# Patient Record
Sex: Female | Born: 1955 | Race: White | Hispanic: No | Marital: Single | Smoking: Former smoker
Health system: Southern US, Community
[De-identification: ages and names within clinical notes are randomized; demographics above are authoritative.]

## PROBLEM LIST (undated history)

## (undated) DIAGNOSIS — C4491 Basal cell carcinoma of skin, unspecified: Secondary | ICD-10-CM

## (undated) DIAGNOSIS — Q631 Lobulated, fused and horseshoe kidney: Secondary | ICD-10-CM

## (undated) DIAGNOSIS — N200 Calculus of kidney: Secondary | ICD-10-CM

## (undated) HISTORY — PX: LIPOMA EXCISION: SHX5283

## (undated) HISTORY — DX: Calculus of kidney: N20.0

## (undated) HISTORY — PX: PARTIAL HYSTERECTOMY: SHX80

## (undated) HISTORY — DX: Lobulated, fused and horseshoe kidney: Q63.1

---

## 1998-04-06 ENCOUNTER — Emergency Department (HOSPITAL_COMMUNITY): Admission: EM | Admit: 1998-04-06 | Discharge: 1998-04-06 | Payer: Self-pay | Admitting: Emergency Medicine

## 1998-11-26 ENCOUNTER — Other Ambulatory Visit: Admission: RE | Admit: 1998-11-26 | Discharge: 1998-11-26 | Payer: Self-pay | Admitting: *Deleted

## 1999-05-27 ENCOUNTER — Emergency Department (HOSPITAL_COMMUNITY): Admission: EM | Admit: 1999-05-27 | Discharge: 1999-05-27 | Payer: Self-pay | Admitting: Emergency Medicine

## 1999-11-01 ENCOUNTER — Other Ambulatory Visit: Admission: RE | Admit: 1999-11-01 | Discharge: 1999-11-01 | Payer: Self-pay | Admitting: *Deleted

## 2000-10-27 ENCOUNTER — Other Ambulatory Visit: Admission: RE | Admit: 2000-10-27 | Discharge: 2000-10-27 | Payer: Self-pay | Admitting: *Deleted

## 2001-11-15 ENCOUNTER — Other Ambulatory Visit: Admission: RE | Admit: 2001-11-15 | Discharge: 2001-11-15 | Payer: Self-pay | Admitting: *Deleted

## 2002-04-27 ENCOUNTER — Emergency Department (HOSPITAL_COMMUNITY): Admission: EM | Admit: 2002-04-27 | Discharge: 2002-04-27 | Payer: Self-pay | Admitting: Emergency Medicine

## 2002-07-19 ENCOUNTER — Encounter: Payer: Self-pay | Admitting: *Deleted

## 2002-07-19 ENCOUNTER — Ambulatory Visit (HOSPITAL_COMMUNITY): Admission: RE | Admit: 2002-07-19 | Discharge: 2002-07-19 | Payer: Self-pay | Admitting: *Deleted

## 2002-07-25 ENCOUNTER — Encounter: Payer: Self-pay | Admitting: Family Medicine

## 2002-07-25 ENCOUNTER — Ambulatory Visit (HOSPITAL_COMMUNITY): Admission: RE | Admit: 2002-07-25 | Discharge: 2002-07-25 | Payer: Self-pay | Admitting: Family Medicine

## 2002-12-22 ENCOUNTER — Other Ambulatory Visit: Admission: RE | Admit: 2002-12-22 | Discharge: 2002-12-22 | Payer: Self-pay | Admitting: *Deleted

## 2002-12-26 ENCOUNTER — Encounter: Admission: RE | Admit: 2002-12-26 | Discharge: 2002-12-26 | Payer: Self-pay | Admitting: *Deleted

## 2002-12-26 ENCOUNTER — Encounter: Payer: Self-pay | Admitting: *Deleted

## 2003-06-16 ENCOUNTER — Encounter: Admission: RE | Admit: 2003-06-16 | Discharge: 2003-06-16 | Payer: Self-pay | Admitting: *Deleted

## 2003-06-16 ENCOUNTER — Encounter: Payer: Self-pay | Admitting: *Deleted

## 2003-12-11 ENCOUNTER — Encounter: Admission: RE | Admit: 2003-12-11 | Discharge: 2003-12-11 | Payer: Self-pay | Admitting: Family Medicine

## 2003-12-19 ENCOUNTER — Other Ambulatory Visit: Admission: RE | Admit: 2003-12-19 | Discharge: 2003-12-19 | Payer: Self-pay | Admitting: Family Medicine

## 2004-12-23 ENCOUNTER — Encounter: Admission: RE | Admit: 2004-12-23 | Discharge: 2004-12-23 | Payer: Self-pay | Admitting: Family Medicine

## 2005-01-10 ENCOUNTER — Other Ambulatory Visit: Admission: RE | Admit: 2005-01-10 | Discharge: 2005-01-10 | Payer: Self-pay | Admitting: Family Medicine

## 2005-12-31 ENCOUNTER — Encounter: Admission: RE | Admit: 2005-12-31 | Discharge: 2005-12-31 | Payer: Self-pay | Admitting: Family Medicine

## 2006-04-22 ENCOUNTER — Other Ambulatory Visit: Admission: RE | Admit: 2006-04-22 | Discharge: 2006-04-22 | Payer: Self-pay | Admitting: Family Medicine

## 2007-01-20 ENCOUNTER — Encounter: Admission: RE | Admit: 2007-01-20 | Discharge: 2007-01-20 | Payer: Self-pay | Admitting: Family Medicine

## 2007-06-22 ENCOUNTER — Other Ambulatory Visit: Admission: RE | Admit: 2007-06-22 | Discharge: 2007-06-22 | Payer: Self-pay | Admitting: Family Medicine

## 2008-01-27 ENCOUNTER — Encounter: Admission: RE | Admit: 2008-01-27 | Discharge: 2008-01-27 | Payer: Self-pay | Admitting: Family Medicine

## 2008-06-15 ENCOUNTER — Other Ambulatory Visit: Admission: RE | Admit: 2008-06-15 | Discharge: 2008-06-15 | Payer: Self-pay | Admitting: Family Medicine

## 2009-01-16 ENCOUNTER — Encounter: Admission: RE | Admit: 2009-01-16 | Discharge: 2009-01-16 | Payer: Self-pay | Admitting: Family Medicine

## 2009-06-20 ENCOUNTER — Other Ambulatory Visit: Admission: RE | Admit: 2009-06-20 | Discharge: 2009-06-20 | Payer: Self-pay | Admitting: Family Medicine

## 2010-04-19 ENCOUNTER — Ambulatory Visit (HOSPITAL_COMMUNITY): Admission: RE | Admit: 2010-04-19 | Discharge: 2010-04-20 | Payer: Self-pay | Admitting: Obstetrics and Gynecology

## 2010-04-19 ENCOUNTER — Encounter (INDEPENDENT_AMBULATORY_CARE_PROVIDER_SITE_OTHER): Payer: Self-pay | Admitting: Obstetrics and Gynecology

## 2010-07-03 ENCOUNTER — Encounter: Admission: RE | Admit: 2010-07-03 | Discharge: 2010-07-03 | Payer: Self-pay | Admitting: Obstetrics and Gynecology

## 2010-09-21 ENCOUNTER — Encounter: Payer: Self-pay | Admitting: Family Medicine

## 2010-11-15 LAB — CBC
Hemoglobin: 11.1 g/dL — ABNORMAL LOW (ref 12.0–15.0)
MCH: 28.7 pg (ref 26.0–34.0)
Platelets: 201 10*3/uL (ref 150–400)
RBC: 3.1 MIL/uL — ABNORMAL LOW (ref 3.87–5.11)
RBC: 3.84 MIL/uL — ABNORMAL LOW (ref 3.87–5.11)
RDW: 14.2 % (ref 11.5–15.5)
WBC: 6.4 10*3/uL (ref 4.0–10.5)
WBC: 7.9 10*3/uL (ref 4.0–10.5)

## 2010-11-15 LAB — TYPE AND SCREEN: ABO/RH(D): O POS

## 2010-11-15 LAB — SURGICAL PCR SCREEN
MRSA, PCR: NEGATIVE
Staphylococcus aureus: NEGATIVE

## 2010-11-15 LAB — BASIC METABOLIC PANEL
BUN: 9 mg/dL (ref 6–23)
Chloride: 104 mEq/L (ref 96–112)
Glucose, Bld: 97 mg/dL (ref 70–99)
Potassium: 3.9 mEq/L (ref 3.5–5.1)

## 2010-12-20 ENCOUNTER — Other Ambulatory Visit: Payer: Self-pay | Admitting: Family Medicine

## 2010-12-20 DIAGNOSIS — I6529 Occlusion and stenosis of unspecified carotid artery: Secondary | ICD-10-CM

## 2010-12-25 ENCOUNTER — Other Ambulatory Visit: Payer: Self-pay

## 2010-12-30 ENCOUNTER — Other Ambulatory Visit: Payer: Self-pay | Admitting: Family Medicine

## 2010-12-30 DIAGNOSIS — E041 Nontoxic single thyroid nodule: Secondary | ICD-10-CM

## 2011-01-01 ENCOUNTER — Other Ambulatory Visit: Payer: Self-pay | Admitting: Diagnostic Radiology

## 2011-01-01 ENCOUNTER — Ambulatory Visit
Admission: RE | Admit: 2011-01-01 | Discharge: 2011-01-01 | Disposition: A | Discharge status: Home / Self Care | Payer: No Typology Code available for payment source | Source: Ambulatory Visit | Attending: Family Medicine | Admitting: Family Medicine

## 2011-01-01 ENCOUNTER — Other Ambulatory Visit (HOSPITAL_COMMUNITY)
Admission: RE | Admit: 2011-01-01 | Discharge: 2011-01-01 | Disposition: A | Discharge status: Home / Self Care | Payer: Self-pay | Source: Ambulatory Visit | Attending: Diagnostic Radiology | Admitting: Diagnostic Radiology

## 2011-01-01 DIAGNOSIS — E041 Nontoxic single thyroid nodule: Secondary | ICD-10-CM

## 2011-01-01 DIAGNOSIS — E049 Nontoxic goiter, unspecified: Secondary | ICD-10-CM | POA: Insufficient documentation

## 2011-05-28 ENCOUNTER — Other Ambulatory Visit: Payer: Self-pay | Admitting: Obstetrics and Gynecology

## 2011-05-28 DIAGNOSIS — Z1231 Encounter for screening mammogram for malignant neoplasm of breast: Secondary | ICD-10-CM

## 2011-07-01 ENCOUNTER — Ambulatory Visit
Admission: RE | Admit: 2011-07-01 | Discharge: 2011-07-01 | Disposition: A | Discharge status: Home / Self Care | Payer: Self-pay | Source: Ambulatory Visit | Attending: Obstetrics and Gynecology | Admitting: Obstetrics and Gynecology

## 2011-07-01 DIAGNOSIS — Z1231 Encounter for screening mammogram for malignant neoplasm of breast: Secondary | ICD-10-CM

## 2012-06-02 ENCOUNTER — Other Ambulatory Visit: Payer: Self-pay | Admitting: Obstetrics and Gynecology

## 2012-06-02 DIAGNOSIS — Z1231 Encounter for screening mammogram for malignant neoplasm of breast: Secondary | ICD-10-CM

## 2012-06-22 ENCOUNTER — Ambulatory Visit: Payer: Self-pay

## 2012-06-30 ENCOUNTER — Ambulatory Visit: Payer: Self-pay

## 2014-02-22 ENCOUNTER — Encounter: Payer: Self-pay | Admitting: Cardiology

## 2016-04-13 ENCOUNTER — Encounter (HOSPITAL_COMMUNITY): Payer: Self-pay

## 2016-04-13 ENCOUNTER — Emergency Department (HOSPITAL_COMMUNITY)
Admission: EM | Admit: 2016-04-13 | Discharge: 2016-04-14 | Disposition: A | Discharge status: Home / Self Care | Payer: Self-pay | Attending: Emergency Medicine | Admitting: Emergency Medicine

## 2016-04-13 ENCOUNTER — Emergency Department (HOSPITAL_COMMUNITY): Payer: Self-pay

## 2016-04-13 DIAGNOSIS — Z87891 Personal history of nicotine dependence: Secondary | ICD-10-CM | POA: Insufficient documentation

## 2016-04-13 DIAGNOSIS — M545 Low back pain, unspecified: Secondary | ICD-10-CM

## 2016-04-13 DIAGNOSIS — M62830 Muscle spasm of back: Secondary | ICD-10-CM | POA: Insufficient documentation

## 2016-04-13 MED ORDER — METHOCARBAMOL 500 MG PO TABS
1000.0000 mg | ORAL_TABLET | Freq: Once | ORAL | Status: AC
  Administered 2016-04-13: 1000 mg via ORAL
  Filled 2016-04-13: qty 2

## 2016-04-13 MED ORDER — KETOROLAC TROMETHAMINE 60 MG/2ML IM SOLN
60.0000 mg | Freq: Once | INTRAMUSCULAR | Status: AC
  Administered 2016-04-13: 60 mg via INTRAMUSCULAR
  Filled 2016-04-13: qty 2

## 2016-04-13 MED ORDER — DIAZEPAM 5 MG/ML IJ SOLN
5.0000 mg | Freq: Once | INTRAMUSCULAR | Status: AC
  Administered 2016-04-13: 5 mg via INTRAMUSCULAR
  Filled 2016-04-13: qty 2

## 2016-04-13 NOTE — ED Triage Notes (Addendum)
Pt reports back spasms starting yesterday evening. States that she has tried ice, ibuprofen, and increasing her fluid intake with no success. A&Ox4. Hx of back spasms, but pt states that they have not been this severe and constant.

## 2016-04-13 NOTE — ED Triage Notes (Signed)
Pt reports taking  ibuprofen at 1000, 1 tramadol at 1430, and  ibuprfen at 1800.

## 2016-04-14 LAB — URINALYSIS, ROUTINE W REFLEX MICROSCOPIC
Bilirubin Urine: NEGATIVE
GLUCOSE, UA: NEGATIVE mg/dL
Hgb urine dipstick: NEGATIVE
Ketones, ur: NEGATIVE mg/dL
LEUKOCYTES UA: NEGATIVE
Nitrite: NEGATIVE
PROTEIN: NEGATIVE mg/dL
Specific Gravity, Urine: 1.003 — ABNORMAL LOW (ref 1.005–1.030)
pH: 6.5 (ref 5.0–8.0)

## 2016-04-14 LAB — I-STAT CHEM 8, ED
BUN: 11 mg/dL (ref 6–20)
CALCIUM ION: 1.15 mmol/L (ref 1.13–1.30)
CHLORIDE: 100 mmol/L — AB (ref 101–111)
CREATININE: 0.8 mg/dL (ref 0.44–1.00)
GLUCOSE: 105 mg/dL — AB (ref 65–99)
HEMATOCRIT: 39 % (ref 36.0–46.0)
HEMOGLOBIN: 13.3 g/dL (ref 12.0–15.0)
POTASSIUM: 4.1 mmol/L (ref 3.5–5.1)
Sodium: 134 mmol/L — ABNORMAL LOW (ref 135–145)
TCO2: 24 mmol/L (ref 0–100)

## 2016-04-14 MED ORDER — HYDROCODONE-ACETAMINOPHEN 5-325 MG PO TABS
2.0000 | ORAL_TABLET | Freq: Once | ORAL | Status: AC
  Administered 2016-04-14: 2 via ORAL
  Filled 2016-04-14: qty 2

## 2016-04-14 MED ORDER — NAPROXEN 500 MG PO TABS: 500.0000 mg | ORAL_TABLET | Freq: Two times a day (BID) | ORAL | 0 refills | Status: AC

## 2016-04-14 MED ORDER — HYDROCODONE-ACETAMINOPHEN 5-325 MG PO TABS: 1.0000 | ORAL_TABLET | Freq: Four times a day (QID) | ORAL | 0 refills | Status: DC | PRN

## 2016-04-14 MED ORDER — CYCLOBENZAPRINE HCL 10 MG PO TABS: 10.0000 mg | ORAL_TABLET | Freq: Three times a day (TID) | ORAL | 0 refills | Status: DC | PRN

## 2016-04-14 NOTE — ED Provider Notes (Signed)
[facility]-EMERGENCY DEPT Provider Note   CSN: [id number] Arrival date & time: 04/13/16  2047  First Provider Contact:  First MD Initiated Contact with Patient 04/13/16 2300      History   Chief Complaint Chief Complaint  Patient presents with  . Back Pain    HPI Brenda Austin is a 60 y.o. female.  The history is provided by the patient, a relative and medical records.  Back Pain   This is a recurrent problem. The current episode started yesterday. The problem occurs constantly. The problem has been gradually worsening. The pain is associated with no known injury. The pain is present in the lumbar spine. The quality of the pain is described as stabbing and cramping. The pain does not radiate. The pain is at a severity of 10/10. The pain is severe. The symptoms are aggravated by bending, certain positions and twisting. The pain is the same all the time. Pertinent negatives include no chest pain, no fever, no numbness, no headaches, no abdominal pain, no abdominal swelling, no bowel incontinence, no perianal numbness, no bladder incontinence, no dysuria, no leg pain, no paresthesias, no tingling and no weakness. She has tried nothing for the symptoms. Risk factors include lack of exercise.    Past Medical History:  Diagnosis Date  . Horseshoe kidney   . Kidney stone     There are no active problems to display for this patient.   History reviewed. No pertinent surgical history.  OB History    No data available       Home Medications    Prior to Admission medications   Medication Sig Start Date End Date Taking? Authorizing Provider  cyclobenzaprine (FLEXERIL) 10 MG tablet Take 1 tablet (10 mg total) by mouth 3 (three) times daily as needed for muscle spasms. 04/14/16   Shaune Pollackameron Irini Leet, MD  HYDROcodone-acetaminophen (NORCO/VICODIN) 5-325 MG tablet Take 1 tablet by mouth every 6 (six) hours as needed for severe pain. 04/14/16   Shaune Pollackameron Sheddrick Lattanzio, MD  naproxen (NAPROSYN) 500 MG tablet  Take 1 tablet (500 mg total) by mouth 2 (two) times daily. 04/14/16 04/19/16  Shaune Pollackameron Lavenia Stumpo, MD    Family History Family History  Problem Relation Age of Onset  . Hypertension Mother   . CVA Mother   . Prostate cancer Father   . Testicular cancer Brother     Social History Social History  Substance Use Topics  . Smoking status: Former Smoker    Quit date: 04/13/2010  . Smokeless tobacco: Never Used  . Alcohol use Yes     Allergies   Codeine sulfate   Review of Systems Review of Systems  Constitutional: Negative for chills, fatigue and fever.  HENT: Negative for congestion and rhinorrhea.   Eyes: Negative for visual disturbance.  Respiratory: Negative for cough, shortness of breath and wheezing.   Cardiovascular: Negative for chest pain and leg swelling.  Gastrointestinal: Negative for abdominal pain, bowel incontinence, diarrhea, nausea and vomiting.  Genitourinary: Negative for bladder incontinence, dysuria and flank pain.  Musculoskeletal: Positive for back pain. Negative for neck pain and neck stiffness.  Skin: Negative for rash and wound.  Allergic/Immunologic: Negative for immunocompromised state.  Neurological: Negative for tingling, syncope, weakness, numbness, headaches and paresthesias.     Physical Exam Updated Vital Signs BP 120/87 (BP Location: Left Arm)   Pulse 67   Temp 98.7 F (37.1 C) (Oral)   Resp 14   Ht 5\' 6"  (1.676 m)   Wt 145 lb (65.8 kg)  SpO2 99%   BMI 23.40 kg/m   Physical Exam  Constitutional: She is oriented to person, place, and time. She appears well-developed and well-nourished. No distress.  HENT:  Head: Normocephalic and atraumatic.  Mouth/Throat: Oropharynx is clear and moist.  Eyes: Conjunctivae are normal. Pupils are equal, round, and reactive to light.  Neck: Neck supple.  Cardiovascular: Normal rate, regular rhythm and normal heart sounds.  Exam reveals no friction rub.   No murmur heard. Pulmonary/Chest: Effort normal  and breath sounds normal. No respiratory distress. She has no wheezes. She has no rales.  Abdominal: She exhibits no distension. There is no tenderness.  Musculoskeletal: She exhibits no edema.  Neurological: She is alert and oriented to person, place, and time. She exhibits normal muscle tone.  Skin: Skin is warm. Capillary refill takes less than 2 seconds.  Nursing note and vitals reviewed.   Spine Exam: Inspection/Palpation: Moderate TTP diffusely throughout lumbar paraspinal muscles, with minimal midline TTP. No rashes or skin lesions Strength: 5/5 throughout LE bilaterally (hip flexion/extension, adduction/abduction; knee flexion/extension; foot dorsiflexion/plantarflexion, inversion/eversion; great toe inversion) Sensation: Intact to light touch in proximal and distal LE bilaterally Reflexes: 2+ quadriceps and achilles reflexes  ED Treatments / Results  Labs (all labs ordered are listed, but only abnormal results are displayed) Labs Reviewed  URINALYSIS, ROUTINE W REFLEX MICROSCOPIC (NOT AT [facility]) - Abnormal; Notable for the following:       Result Value   Specific Gravity, Urine 1.003 (*)    All other components within normal limits  I-STAT CHEM 8, ED - Abnormal; Notable for the following:    Sodium 134 (*)    Chloride 100 (*)    Glucose, Bld 105 (*)    All other components within normal limits    EKG  EKG Interpretation None       Radiology Dg Chest 2 View  Result Date: 04/14/2016 CLINICAL DATA:  Left lower rib cage pain antral laterally. History of hypertension. Current smoker. EXAM: CHEST  2 VIEW COMPARISON:  None. FINDINGS: Mild thoracolumbar scoliosis convex towards the right. Normal heart size and pulmonary vascularity. No focal airspace disease or consolidation in the lungs. No blunting of costophrenic angles. No pneumothorax. Mediastinal contours appear intact. IMPRESSION: No active cardiopulmonary disease. Electronically Signed   By: Burman Nieves M.D.   On:  04/14/2016 00:20   Dg Lumbar Spine Complete  Result Date: 04/14/2016 CLINICAL DATA:  Chronic low back pain.  No injury but pain is worse. EXAM: LUMBAR SPINE - COMPLETE 4+ VIEW COMPARISON:  None. FINDINGS: Mild lumbar curvature is probably positional. Mild degenerative changes with small endplate hypertrophic spur. Degenerative disc disease at the lumbosacral interspace. No vertebral compression deformities. No focal bone lesion or bone destruction. Bone cortex appears intact. Visualize sacrum appears intact. IMPRESSION: Mild degenerative changes in the lumbar spine. No acute displaced fractures identified. Electronically Signed   By: Burman Nieves M.D.   On: 04/14/2016 00:18    Procedures Procedures (including critical care time)  Medications Ordered in ED Medications  ketorolac (TORADOL) injection 60 mg (60 mg Intramuscular Given 04/13/16 2328)  diazepam (VALIUM) injection 5 mg (5 mg Intramuscular Given 04/13/16 2327)  methocarbamol (ROBAXIN) tablet 1,000 mg (1,000 mg Oral Given 04/13/16 2327)  HYDROcodone-acetaminophen (NORCO/VICODIN) 5-325 MG per tablet 2 tablet (2 tablets Oral Given 04/14/16 0032)     Initial Impression / Assessment and Plan / ED Course  I have reviewed the triage vital signs and the nursing notes.  Pertinent labs & imaging  results that were available during my care of the patient were reviewed by me and considered in my medical decision making (see chart for details).  Clinical Course   60 yo F with PMHx of nephrolithiasis, recurrent bilateral back spasms and chronic LBP who p/w acute on chronic LBP for 2 days. No trauma. On arrival, VSS and WNL. Exam is as above, remarkable for marked TTP over paraspinal muscles b/l. No LE weakness or numbness. No loss of bowel or bladder function. Exam, history is c/w likely mild, acute on chronic paraspinal spasm and sprain due to underlying degenerative disease. Plain films show no fx or bony pathology. No red flags. No fever,  chills, IVDU, or signs of epidural abscess, psoas abscess, or osteo. No signs of cauda equina or acute cord compression. UA is unremarkable with no signs to suggest stone or pyelo. She has no significant electrolyte abnormalities. Symptoms improved with analgesia, valium, muscle relaxants. Will d/c with NSAIDs, muscle relaxants, and PCP f/u. [place] [facility] database reviewed.  Final Clinical Impressions(s) / ED Diagnoses   Final diagnoses:  Bilateral low back pain without sciatica  Muscle spasm of back    New Prescriptions Discharge Medication List as of 04/14/2016 12:53 AM    START taking these medications   Details  cyclobenzaprine (FLEXERIL) 10 MG tablet Take 1 tablet (10 mg total) by mouth 3 (three) times daily as needed for muscle spasms., Starting Mon 04/14/2016, Print    HYDROcodone-acetaminophen (NORCO/VICODIN) 5-325 MG tablet Take 1 tablet by mouth every 6 (six) hours as needed for severe pain., Starting Mon 04/14/2016, Print    naproxen (NAPROSYN) 500 MG tablet Take 1 tablet (500 mg total) by mouth 2 (two) times daily., Starting Mon 04/14/2016, Until Sat 04/19/2016, Print         Shaune Pollackameron Kaari Zeigler, MD 04/14/16 1028

## 2017-01-27 IMAGING — CR DG LUMBAR SPINE COMPLETE 4+V
5 series · 5 of 5 positions shown · non-contrast
Comparison: None.

CLINICAL DATA: Chronic low back pain.  No injury but pain is worse.

EXAM:
LUMBAR SPINE - COMPLETE 4+ VIEW

[t lumbar spine obl (1 of 2)]
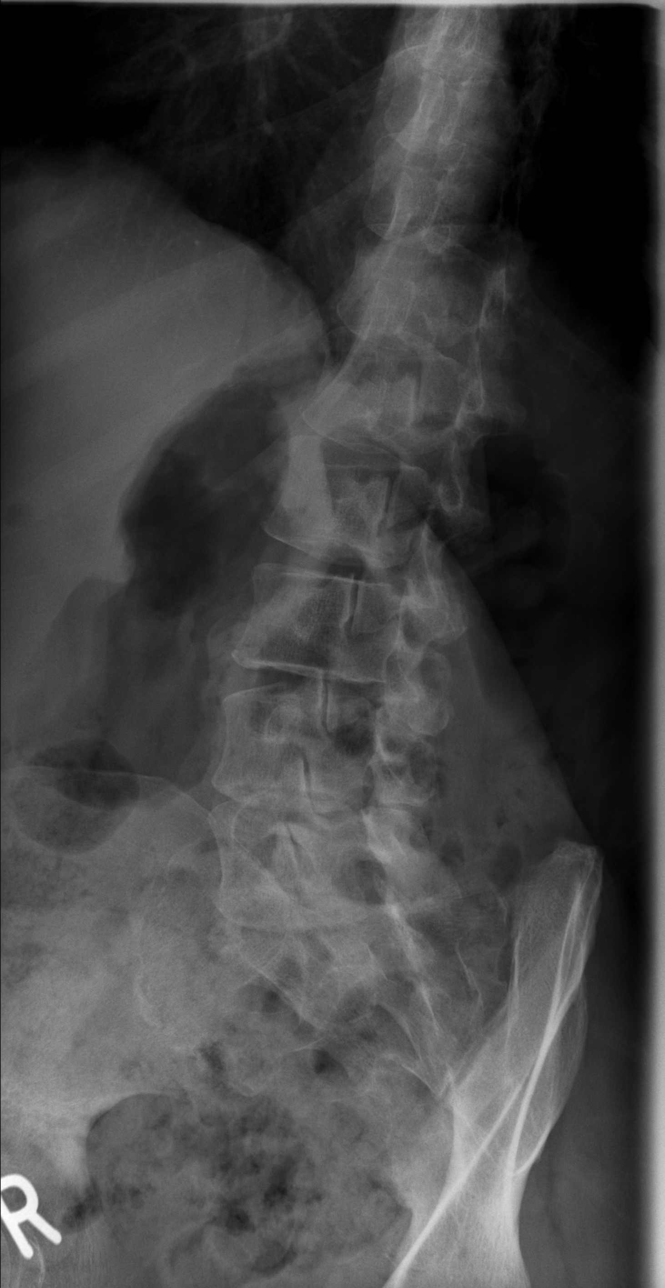

[t lumbar spine ap]
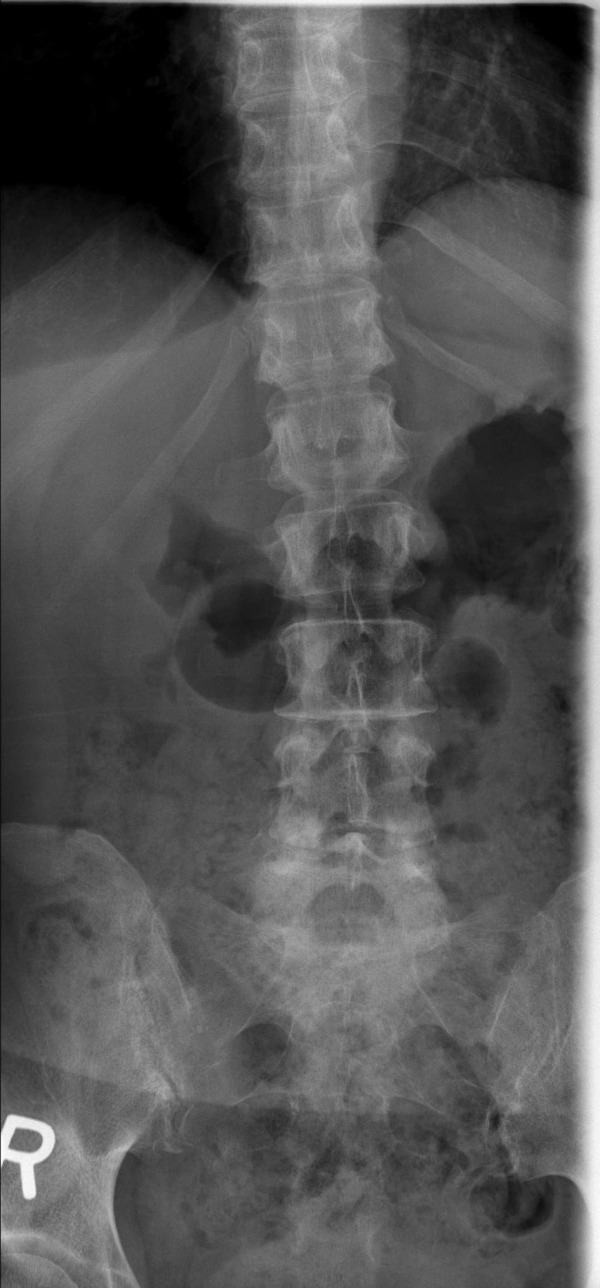

[t lumbar spine obl (2 of 2)]
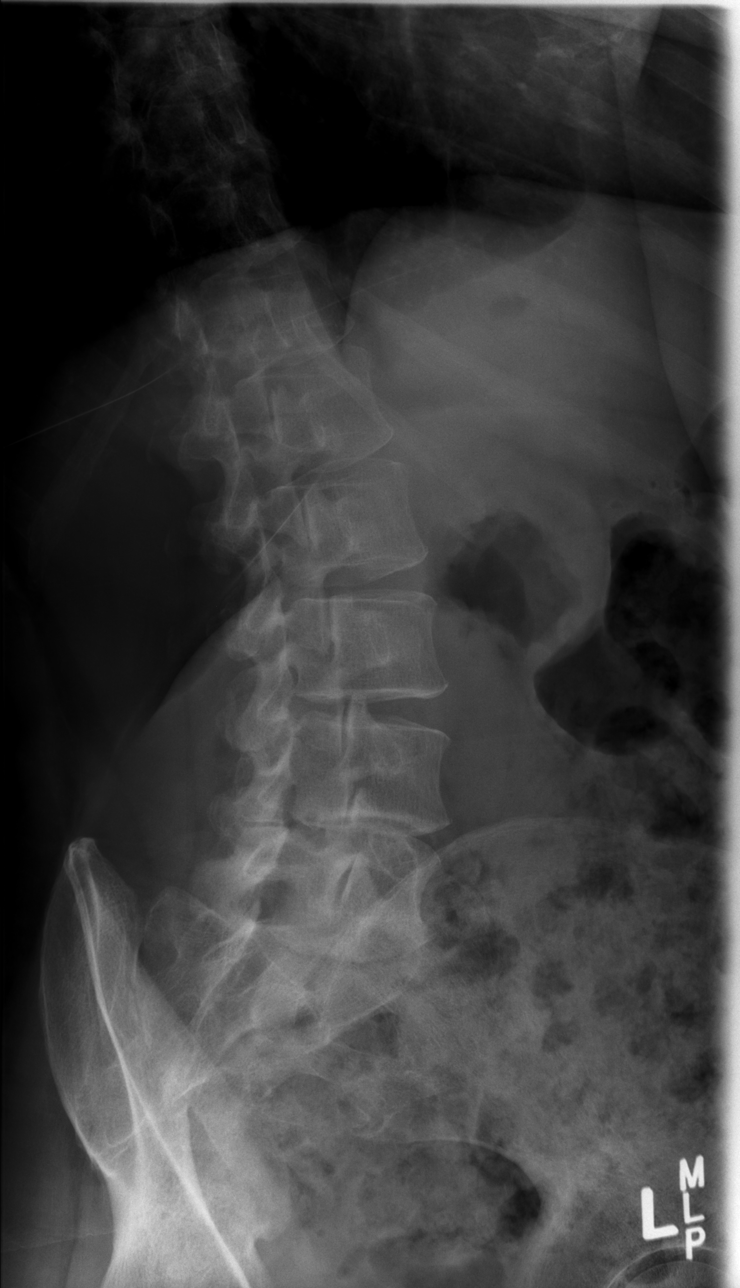

[t lumbar spine lat]
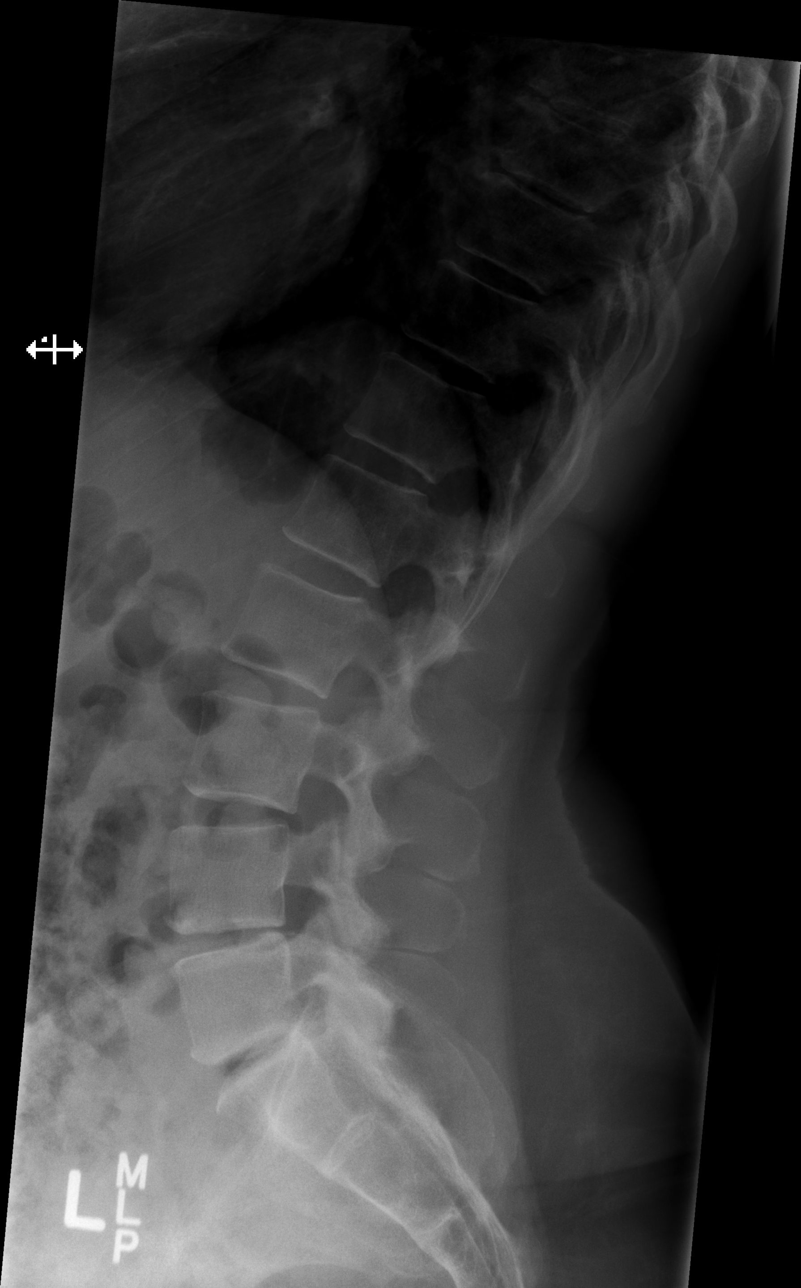

[t lumbar l-5 s-1 spot]
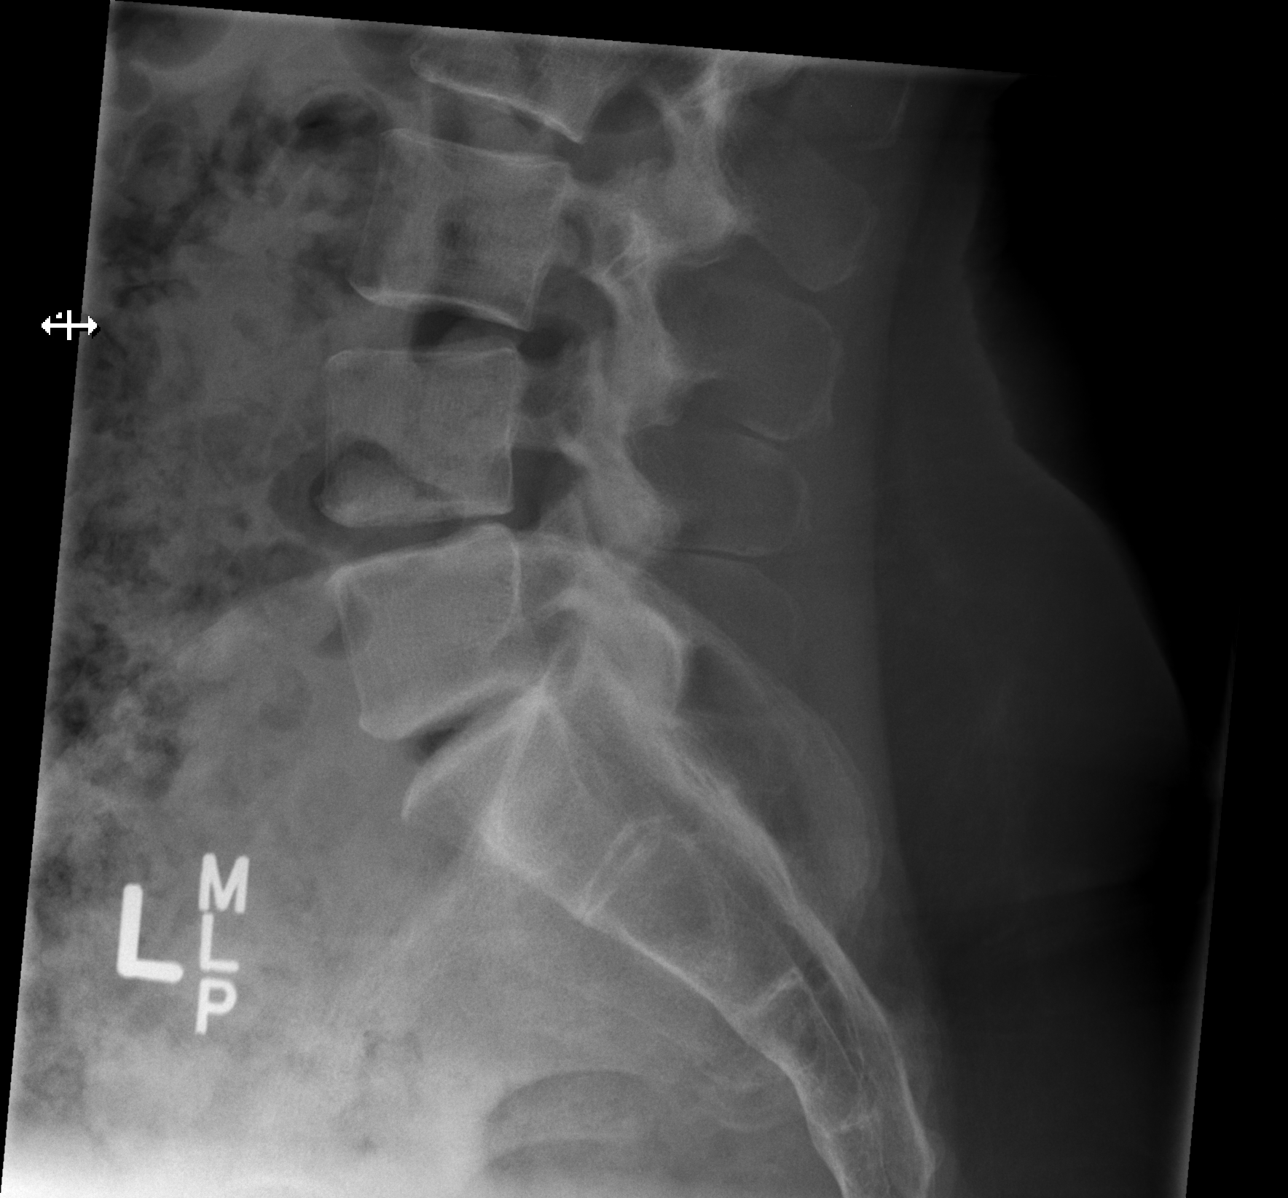

[5 of 5 positions shown; findings below may reference images not displayed]

FINDINGS: Mild lumbar curvature is probably positional. Mild degenerative
changes with small endplate hypertrophic spur. Degenerative disc
disease at the lumbosacral interspace. No vertebral compression
deformities. No focal bone lesion or bone destruction. Bone cortex
appears intact. Visualize sacrum appears intact.
IMPRESSION: Mild degenerative changes in the lumbar spine. No acute displaced
fractures identified.

## 2022-11-19 DIAGNOSIS — M25531 Pain in right wrist: Secondary | ICD-10-CM | POA: Diagnosis not present

## 2023-03-09 DIAGNOSIS — H04123 Dry eye syndrome of bilateral lacrimal glands: Secondary | ICD-10-CM | POA: Diagnosis not present

## 2023-03-09 DIAGNOSIS — H1045 Other chronic allergic conjunctivitis: Secondary | ICD-10-CM | POA: Diagnosis not present

## 2023-03-23 DIAGNOSIS — Z124 Encounter for screening for malignant neoplasm of cervix: Secondary | ICD-10-CM | POA: Diagnosis not present

## 2023-03-23 DIAGNOSIS — Z01411 Encounter for gynecological examination (general) (routine) with abnormal findings: Secondary | ICD-10-CM | POA: Diagnosis not present

## 2023-03-23 DIAGNOSIS — Z1151 Encounter for screening for human papillomavirus (HPV): Secondary | ICD-10-CM | POA: Diagnosis not present

## 2023-03-24 DIAGNOSIS — H04123 Dry eye syndrome of bilateral lacrimal glands: Secondary | ICD-10-CM | POA: Diagnosis not present

## 2023-03-24 DIAGNOSIS — H1045 Other chronic allergic conjunctivitis: Secondary | ICD-10-CM | POA: Diagnosis not present

## 2023-04-07 DIAGNOSIS — Z1231 Encounter for screening mammogram for malignant neoplasm of breast: Secondary | ICD-10-CM | POA: Diagnosis not present

## 2023-04-13 ENCOUNTER — Emergency Department (HOSPITAL_COMMUNITY): Payer: Medicare Other

## 2023-04-13 ENCOUNTER — Inpatient Hospital Stay (HOSPITAL_COMMUNITY)
Admission: EM | Admit: 2023-04-13 | Discharge: 2023-04-16 | DRG: 872 | Disposition: A | Discharge status: Home / Self Care | Payer: Medicare Other | Attending: Family Medicine | Admitting: Family Medicine

## 2023-04-13 ENCOUNTER — Other Ambulatory Visit: Payer: Self-pay

## 2023-04-13 ENCOUNTER — Encounter (HOSPITAL_COMMUNITY): Payer: Self-pay | Admitting: Student

## 2023-04-13 DIAGNOSIS — R9431 Abnormal electrocardiogram [ECG] [EKG]: Secondary | ICD-10-CM | POA: Diagnosis not present

## 2023-04-13 DIAGNOSIS — I959 Hypotension, unspecified: Secondary | ICD-10-CM | POA: Diagnosis not present

## 2023-04-13 DIAGNOSIS — R6889 Other general symptoms and signs: Secondary | ICD-10-CM | POA: Diagnosis not present

## 2023-04-13 DIAGNOSIS — W5501XA Bitten by cat, initial encounter: Secondary | ICD-10-CM | POA: Diagnosis not present

## 2023-04-13 DIAGNOSIS — Z85828 Personal history of other malignant neoplasm of skin: Secondary | ICD-10-CM

## 2023-04-13 DIAGNOSIS — Z23 Encounter for immunization: Secondary | ICD-10-CM

## 2023-04-13 DIAGNOSIS — S81851A Open bite, right lower leg, initial encounter: Secondary | ICD-10-CM | POA: Diagnosis present

## 2023-04-13 DIAGNOSIS — A084 Viral intestinal infection, unspecified: Secondary | ICD-10-CM | POA: Diagnosis not present

## 2023-04-13 DIAGNOSIS — A419 Sepsis, unspecified organism: Secondary | ICD-10-CM | POA: Diagnosis not present

## 2023-04-13 DIAGNOSIS — Z87442 Personal history of urinary calculi: Secondary | ICD-10-CM

## 2023-04-13 DIAGNOSIS — Z79899 Other long term (current) drug therapy: Secondary | ICD-10-CM | POA: Diagnosis not present

## 2023-04-13 DIAGNOSIS — L03115 Cellulitis of right lower limb: Secondary | ICD-10-CM | POA: Diagnosis present

## 2023-04-13 DIAGNOSIS — T148XXA Other injury of unspecified body region, initial encounter: Secondary | ICD-10-CM | POA: Diagnosis not present

## 2023-04-13 DIAGNOSIS — M7989 Other specified soft tissue disorders: Secondary | ICD-10-CM | POA: Diagnosis not present

## 2023-04-13 DIAGNOSIS — Z90711 Acquired absence of uterus with remaining cervical stump: Secondary | ICD-10-CM

## 2023-04-13 DIAGNOSIS — E872 Acidosis, unspecified: Secondary | ICD-10-CM | POA: Diagnosis not present

## 2023-04-13 DIAGNOSIS — E860 Dehydration: Secondary | ICD-10-CM | POA: Diagnosis present

## 2023-04-13 DIAGNOSIS — R6520 Severe sepsis without septic shock: Secondary | ICD-10-CM | POA: Diagnosis present

## 2023-04-13 DIAGNOSIS — R198 Other specified symptoms and signs involving the digestive system and abdomen: Secondary | ICD-10-CM | POA: Insufficient documentation

## 2023-04-13 DIAGNOSIS — Z885 Allergy status to narcotic agent status: Secondary | ICD-10-CM | POA: Diagnosis not present

## 2023-04-13 DIAGNOSIS — Z1152 Encounter for screening for COVID-19: Secondary | ICD-10-CM | POA: Diagnosis not present

## 2023-04-13 DIAGNOSIS — R000 Tachycardia, unspecified: Secondary | ICD-10-CM | POA: Diagnosis not present

## 2023-04-13 DIAGNOSIS — R112 Nausea with vomiting, unspecified: Secondary | ICD-10-CM | POA: Diagnosis not present

## 2023-04-13 DIAGNOSIS — N179 Acute kidney failure, unspecified: Secondary | ICD-10-CM | POA: Diagnosis not present

## 2023-04-13 HISTORY — DX: Basal cell carcinoma of skin, unspecified: C44.91

## 2023-04-13 LAB — CBC WITH DIFFERENTIAL/PLATELET
Abs Immature Granulocytes: 0.3 10*3/uL — ABNORMAL HIGH (ref 0.00–0.07)
Basophils Absolute: 0 10*3/uL (ref 0.0–0.1)
Basophils Relative: 0 %
Eosinophils Absolute: 0 10*3/uL (ref 0.0–0.5)
Eosinophils Relative: 0 %
HCT: 39.1 % (ref 36.0–46.0)
Hemoglobin: 12.5 g/dL (ref 12.0–15.0)
Immature Granulocytes: 2 %
Lymphocytes Relative: 4 %
Lymphs Abs: 0.6 10*3/uL — ABNORMAL LOW (ref 0.7–4.0)
MCH: 29.4 pg (ref 26.0–34.0)
MCHC: 32 g/dL (ref 30.0–36.0)
MCV: 92 fL (ref 80.0–100.0)
Monocytes Absolute: 0.9 10*3/uL (ref 0.1–1.0)
Monocytes Relative: 6 %
Neutro Abs: 14.9 10*3/uL — ABNORMAL HIGH (ref 1.7–7.7)
Neutrophils Relative %: 88 %
Platelets: 242 10*3/uL (ref 150–400)
RBC: 4.25 MIL/uL (ref 3.87–5.11)
RDW: 13.4 % (ref 11.5–15.5)
WBC: 16.7 10*3/uL — ABNORMAL HIGH (ref 4.0–10.5)
nRBC: 0 % (ref 0.0–0.2)

## 2023-04-13 LAB — COMPREHENSIVE METABOLIC PANEL
ALT: 19 U/L (ref 0–44)
ALT: 19 U/L (ref 0–44)
AST: 25 U/L (ref 15–41)
AST: 29 U/L (ref 15–41)
Albumin: 3.2 g/dL — ABNORMAL LOW (ref 3.5–5.0)
Albumin: 3.4 g/dL — ABNORMAL LOW (ref 3.5–5.0)
Alkaline Phosphatase: 49 U/L (ref 38–126)
Alkaline Phosphatase: 54 U/L (ref 38–126)
Anion gap: 11 (ref 5–15)
Anion gap: 13 (ref 5–15)
BUN: 17 mg/dL (ref 8–23)
BUN: 18 mg/dL (ref 8–23)
CO2: 21 mmol/L — ABNORMAL LOW (ref 22–32)
CO2: 22 mmol/L (ref 22–32)
Calcium: 9.4 mg/dL (ref 8.9–10.3)
Calcium: 9.6 mg/dL (ref 8.9–10.3)
Chloride: 102 mmol/L (ref 98–111)
Chloride: 103 mmol/L (ref 98–111)
Creatinine, Ser: 1.21 mg/dL — ABNORMAL HIGH (ref 0.44–1.00)
Creatinine, Ser: 1.24 mg/dL — ABNORMAL HIGH (ref 0.44–1.00)
GFR, Estimated: 48 mL/min — ABNORMAL LOW (ref >60)
GFR, Estimated: 50 mL/min — ABNORMAL LOW (ref >60)
Glucose, Bld: 113 mg/dL — ABNORMAL HIGH (ref 70–99)
Glucose, Bld: 127 mg/dL — ABNORMAL HIGH (ref 70–99)
Potassium: 4 mmol/L (ref 3.5–5.1)
Potassium: 4.2 mmol/L (ref 3.5–5.1)
Sodium: 135 mmol/L (ref 135–145)
Sodium: 137 mmol/L (ref 135–145)
Total Bilirubin: 2.4 mg/dL — ABNORMAL HIGH (ref 0.3–1.2)
Total Bilirubin: 2.5 mg/dL — ABNORMAL HIGH (ref 0.3–1.2)
Total Protein: 5.9 g/dL — ABNORMAL LOW (ref 6.5–8.1)
Total Protein: 6.2 g/dL — ABNORMAL LOW (ref 6.5–8.1)

## 2023-04-13 LAB — URINALYSIS, ROUTINE W REFLEX MICROSCOPIC
Bilirubin Urine: NEGATIVE
Glucose, UA: NEGATIVE mg/dL
Hgb urine dipstick: NEGATIVE
Ketones, ur: NEGATIVE mg/dL
Leukocytes,Ua: NEGATIVE
Nitrite: NEGATIVE
Protein, ur: NEGATIVE mg/dL
Specific Gravity, Urine: 1.017 (ref 1.005–1.030)
pH: 5 (ref 5.0–8.0)

## 2023-04-13 LAB — I-STAT CG4 LACTIC ACID, ED
Lactic Acid, Venous: 2.9 mmol/L (ref 0.5–1.9)
Lactic Acid, Venous: 2.7 mmol/L (ref 0.5–1.9)

## 2023-04-13 LAB — LIPASE, BLOOD: Lipase: 28 U/L (ref 11–51)

## 2023-04-13 LAB — PROTIME-INR
INR: 1.1 (ref 0.8–1.2)
Prothrombin Time: 14.3 seconds (ref 11.4–15.2)

## 2023-04-13 LAB — APTT: aPTT: 31 seconds (ref 24–36)

## 2023-04-13 LAB — LACTIC ACID, PLASMA
Lactic Acid, Venous: 2.1 mmol/L (ref 0.5–1.9)
Lactic Acid, Venous: 2.5 mmol/L (ref 0.5–1.9)

## 2023-04-13 LAB — HIV ANTIBODY (ROUTINE TESTING W REFLEX): HIV Screen 4th Generation wRfx: NONREACTIVE

## 2023-04-13 MED ORDER — ACETAMINOPHEN 650 MG RE SUPP: 650.0000 mg | Freq: Four times a day (QID) | RECTAL | Status: DC

## 2023-04-13 MED ORDER — PIPERACILLIN-TAZOBACTAM 3.375 G IVPB
3.3750 g | Freq: Three times a day (TID) | INTRAVENOUS | Status: DC
  Administered 2023-04-13 – 2023-04-14 (×2): 3.375 g via INTRAVENOUS
  Filled 2023-04-13 (×2): qty 50

## 2023-04-13 MED ORDER — ACETAMINOPHEN 325 MG PO TABS: 650.0000 mg | ORAL_TABLET | Freq: Four times a day (QID) | ORAL | Status: DC | PRN

## 2023-04-13 MED ORDER — ONDANSETRON HCL 4 MG PO TABS
4.0000 mg | ORAL_TABLET | Freq: Four times a day (QID) | ORAL | Status: DC | PRN
  Administered 2023-04-14: 4 mg via ORAL
  Filled 2023-04-13: qty 1

## 2023-04-13 MED ORDER — ACETAMINOPHEN 325 MG PO TABS
650.0000 mg | ORAL_TABLET | Freq: Four times a day (QID) | ORAL | Status: DC
  Administered 2023-04-13 – 2023-04-16 (×9): 650 mg via ORAL
  Filled 2023-04-13 (×9): qty 2

## 2023-04-13 MED ORDER — ONDANSETRON HCL 4 MG/2ML IJ SOLN
4.0000 mg | Freq: Four times a day (QID) | INTRAMUSCULAR | Status: DC | PRN
  Administered 2023-04-14: 4 mg via INTRAVENOUS
  Filled 2023-04-13: qty 2

## 2023-04-13 MED ORDER — FENTANYL CITRATE PF 50 MCG/ML IJ SOSY
50.0000 ug | PREFILLED_SYRINGE | Freq: Once | INTRAMUSCULAR | Status: AC
  Administered 2023-04-13: 50 ug via INTRAVENOUS
  Filled 2023-04-13: qty 1

## 2023-04-13 MED ORDER — LACTATED RINGERS IV SOLN: INTRAVENOUS | Status: DC

## 2023-04-13 MED ORDER — PIPERACILLIN-TAZOBACTAM 3.375 G IVPB 30 MIN
3.3750 g | Freq: Once | INTRAVENOUS | Status: AC
  Administered 2023-04-13: 3.375 g via INTRAVENOUS
  Filled 2023-04-13: qty 50

## 2023-04-13 MED ORDER — SODIUM CHLORIDE 0.9 % IV BOLUS (SEPSIS)
1000.0000 mL | Freq: Once | INTRAVENOUS | Status: AC
  Administered 2023-04-13: 1000 mL via INTRAVENOUS

## 2023-04-13 MED ORDER — ACETAMINOPHEN 650 MG RE SUPP: 650.0000 mg | Freq: Four times a day (QID) | RECTAL | Status: DC | PRN

## 2023-04-13 MED ORDER — ENOXAPARIN SODIUM 40 MG/0.4ML IJ SOSY
40.0000 mg | PREFILLED_SYRINGE | INTRAMUSCULAR | Status: DC
  Administered 2023-04-14 – 2023-04-16 (×3): 40 mg via SUBCUTANEOUS
  Filled 2023-04-13 (×3): qty 0.4

## 2023-04-13 MED ORDER — ONDANSETRON HCL 4 MG/2ML IJ SOLN
4.0000 mg | Freq: Once | INTRAMUSCULAR | Status: AC
  Administered 2023-04-13: 4 mg via INTRAVENOUS
  Filled 2023-04-13: qty 2

## 2023-04-13 MED ORDER — OXYCODONE HCL 5 MG PO TABS
5.0000 mg | ORAL_TABLET | Freq: Four times a day (QID) | ORAL | Status: DC | PRN
  Administered 2023-04-13 – 2023-04-15 (×3): 5 mg via ORAL
  Filled 2023-04-13 (×3): qty 1

## 2023-04-13 MED ORDER — ACETAMINOPHEN 500 MG PO TABS
1000.0000 mg | ORAL_TABLET | Freq: Once | ORAL | Status: AC
  Administered 2023-04-13: 1000 mg via ORAL
  Filled 2023-04-13: qty 2

## 2023-04-13 MED ORDER — LACTATED RINGERS IV BOLUS
1000.0000 mL | Freq: Once | INTRAVENOUS | Status: AC
  Administered 2023-04-13: 1000 mL via INTRAVENOUS

## 2023-04-13 NOTE — Progress Notes (Signed)
FMTS Brief Progress Note  S:Patient evaluated with Dr. Elliot Gurney and daughter bedside. Pain still ongoing but improved from earlier. Oxycodone with some relief. Denies any current fevers, chills, shortness of breath, nausea or vomiting. In better spirits.   O: BP 101/67   Pulse 93   Temp 97.8 F (36.6 C) (Oral)   Resp 17   Ht 5\' 5"  (1.651 m)   Wt 145 lb (65.8 kg)   SpO2 99%   BMI 24.13 kg/m    Physical Exam Constitutional:      Appearance: No acute distress Cardiovascular:     Rate and Rhythm: Regular rhythm. present.     Heart sounds: No murmur heard. Pulmonary:     Breath sounds: Normal work of breathing, CTAB Musculoskeletal:     Cervical back: Normal range of motion.     Right lower leg: Erythema, tenderness and warmth on anterior and posterior shin. Bite wound on anterior shin. Cat scratch like marks on the posterior shin.     Left lower leg: No edema.  Neurological:     Mental Status:She is alert.   A/P: Brenda Austin is a 67 y.o. female who was admitted for concern for sepsis from lower extremity cellulitis after a cat bite. The patient was given a dose of zosyn in the ED and multiple boluses in setting of hypotension, tachycardia, elevated WBC and erythematous wound. Zosyn reordered during night shift. Oxycodone prn for pain control and scheduled tylenol.   Cellulitis 2/2 to cat bit  - Zosyn x1 in ED  - Zosyn IV q8h ordered during night shift  - f/u B[facility]  - LA 2.1 >> 2.5 most recent labs - Orders reviewed. Labs for AM ordered, which was adjusted as needed.  - If condition changes, plan includes page with concerns .   Peterson Ao, MD 04/13/2023, 9:19 PM PGY-1, [facility] Night Resident  Please page [phone] with questions.

## 2023-04-13 NOTE — Hospital Course (Signed)
Brenda Austin is a 67 y.o.female who was admitted to the [facility] Medicine Teaching Service at [facility] for sepsis due to cellulitis after a cat bite. Her hospital course is detailed below:  Sepsis 2/2 to lower extremity cellulitis  Patient presented to the ED with nausea, vomiting and pain in her right leg after cat bite. Bite occurred on the right shin on 8/11. Reported swelling, pain, erythema, subjective fevers, chills and green, non bloody emesis. While in the ED, she was hypotensive to 80/61 initially and was found to have a lactic acid of 2.9 and leukocytosis to 16.7. Her blood pressure subsequently improved. Physical exam was notable for erythema and 3 puncture wounds on the RLE, one anteriorly and two posteriorly. X-ray of her RLE showed subcutaneous soft tissue edema and no acute fracture or foreign body.  Blood cultures showed no growth >2 days. Patient was started on Unasyn for coverage of Pasteurella and transition to 5 day course of augmentin prior to discharge with meloxicam for 2 weeks. Patient received tetanus shot during admission.  Acute Kidney Injury  Creatinine elevated at 1.24, above baseline of 0.8. Suspected pre-renal in setting of fluid loss from diarrhea, vomiting and poor PO intake. She was given 2L boluses of fluids for resuscitation and started on fluids. AKI was resolved on discharge.  Other chronic conditions were medically managed with home medications and formulary alternatives as necessary (none)  PCP Follow-up Recommendations: Follow-up on bite on right shin Ensure patient completed full dose of antibiotics

## 2023-04-13 NOTE — Assessment & Plan Note (Addendum)
Presented with leukocytosis, hypotension, tachycardia, and an elevated lactic acid. Afebrile but reports subjective fevers at home. Potential etiologies include cellulitis vs osteomyelitis from cat bite vs viral GI illness vs other infectious process. Most likely cellulitis plus viral gastroenteritis given physical exam findings and clinical improvement with ABX and fluids. -maintenance fluids @ 181mL/hr LR -PRN zofran -continuous telemetry x24 hours -f/u blood cx -f/u repeat lactic acid -AM CBC

## 2023-04-13 NOTE — Assessment & Plan Note (Addendum)
Creatinine 1.24 from a baseline of 0.77-0.80 Most likely prerenal in the setting of fluid loss from diarrhea, vomiting, and poor PO intake Has received 2x 1L fluid boluses in the ED -fluids as above -AM BMP

## 2023-04-13 NOTE — ED Provider Notes (Signed)
[place] EMERGENCY DEPARTMENT AT [facility] Provider Note   CSN: [id number] Arrival date & time: 04/13/23  1152     History  Chief Complaint  Patient presents with   Emesis   Animal Bite    Brenda Austin is a 67 y.o. female.  Patient is an otherwise healthy 67 year old female presenting today for right leg pain, vomiting, and fevers beginning yesterday.  She notes that yesterday morning she was bit by her own cat on the right leg.  She has 3 small wounds to her right leg.  She since noted worsening pain and swelling to the area.  By last night she began feeling feverish and chilled.  This morning, she began vomiting.  She denies any hematemesis but does note that it was green in color.  She has had increasing lightheadedness and aches through the day.  She denies any chest pain or shortness of breath.  She has no abdominal pain.  She has no dysuria or hematuria.  She has no cough or further rashes.  She is unsure of the date of her last tetanus shot.  She states her cat is up-to-date on his immunizations.     Home Medications Prior to Admission medications   Medication Sig Start Date End Date Taking? Authorizing Provider  cyclobenzaprine (FLEXERIL) 10 MG tablet Take 1 tablet (10 mg total) by mouth 3 (three) times daily as needed for muscle spasms. 04/14/16   Shaune Pollack, MD  HYDROcodone-acetaminophen (NORCO/VICODIN) 5-325 MG tablet Take 1 tablet by mouth every 6 (six) hours as needed for severe pain. 04/14/16   Shaune Pollack, MD      Allergies    Codeine sulfate    Review of Systems   Negative except as noted above in HPI  Physical Exam Updated Vital Signs BP 125/73   Pulse (!) 102   Temp 98.6 F (37 C) (Oral)   Resp 18   Ht 5\' 5"  (1.651 m)   Wt 65.8 kg   SpO2 100%   BMI 24.13 kg/m  Physical Exam Vitals and nursing note reviewed.  Constitutional:      General: She is not in acute distress.    Appearance: She is well-developed.  HENT:     Head:  Normocephalic and atraumatic.  Eyes:     Conjunctiva/sclera: Conjunctivae normal.  Cardiovascular:     Rate and Rhythm: Regular rhythm. Tachycardia present.     Heart sounds: No murmur heard. Pulmonary:     Effort: Pulmonary effort is normal. No respiratory distress.     Breath sounds: Normal breath sounds.     Comments: Saturating well on room air Abdominal:     Palpations: Abdomen is soft.     Tenderness: There is no abdominal tenderness.  Musculoskeletal:        General: Swelling (1+ pitting edema to the right lower extremity) present.     Cervical back: Neck supple.  Skin:    General: Skin is warm and dry.     Capillary Refill: Capillary refill takes less than 2 seconds.     Findings: Erythema present.     Comments: Mild erythema noted to the right anterior lower leg with small puncture wound appreciated to the anterior leg as well as 2 smaller puncture wounds noted to the posterior lower leg.  2+ pedal pulse  Neurological:     Mental Status: She is alert and oriented to person, place, and time.     Sensory: No sensory deficit.  Motor: No weakness.  Psychiatric:        Mood and Affect: Mood normal.     ED Results / Procedures / Treatments   Labs (all labs ordered are listed, but only abnormal results are displayed) Labs Reviewed  COMPREHENSIVE METABOLIC PANEL - Abnormal; Notable for the following components:      Result Value   Glucose, Bld 127 (*)    Creatinine, Ser 1.21 (*)    Total Protein 5.9 (*)    Albumin 3.2 (*)    Total Bilirubin 2.4 (*)    GFR, Estimated 50 (*)    All other components within normal limits  COMPREHENSIVE METABOLIC PANEL - Abnormal; Notable for the following components:   CO2 21 (*)    Glucose, Bld 113 (*)    Creatinine, Ser 1.24 (*)    Total Protein 6.2 (*)    Albumin 3.4 (*)    Total Bilirubin 2.5 (*)    GFR, Estimated 48 (*)    All other components within normal limits  CBC WITH DIFFERENTIAL/PLATELET - Abnormal; Notable for the  following components:   WBC 16.7 (*)    Neutro Abs 14.9 (*)    Lymphs Abs 0.6 (*)    Abs Immature Granulocytes 0.30 (*)    All other components within normal limits  I-STAT CG4 LACTIC ACID, ED - Abnormal; Notable for the following components:   Lactic Acid, Venous 2.9 (*)    All other components within normal limits  I-STAT CG4 LACTIC ACID, ED - Abnormal; Notable for the following components:   Lactic Acid, Venous 2.7 (*)    All other components within normal limits  CULTURE, BLOOD (ROUTINE X 2)  CULTURE, BLOOD (ROUTINE X 2)  URINALYSIS, ROUTINE W REFLEX MICROSCOPIC  LIPASE, BLOOD  PROTIME-INR  APTT  CBC WITH DIFFERENTIAL/PLATELET  CBC WITH DIFFERENTIAL/PLATELET  HIV ANTIBODY (ROUTINE TESTING W REFLEX)  LACTIC ACID, PLASMA  LACTIC ACID, PLASMA  I-STAT CG4 LACTIC ACID, ED    EKG EKG Interpretation Date/Time:  Monday April 13 2023 13:29:21 EDT Ventricular Rate:  91 PR Interval:  164 QRS Duration:  82 QT Interval:  350 QTC Calculation: 431 R Axis:   84  Text Interpretation: Sinus rhythm Probable anterior infarct, old Nonspecific T abnormalities, lateral leads Confirmed by Eber Hong ([id number]) on 04/13/2023 1:52:17 PM  Radiology DG Tibia/Fibula Right  Result Date: 04/13/2023 CLINICAL DATA:  cat bite EXAM: RIGHT TIBIA AND FIBULA - 2 VIEW COMPARISON:  None Available. FINDINGS: There is no evidence of fracture or other focal bone lesions. Subcutaneus soft tissue edema of the anterior mid leg. No retained radiopaque foreign body. Dermal calcification peer IMPRESSION: 1. No acute displaced fracture or dislocation. 2. Subcutaneus soft tissue edema of the anterior mid leg. No retained radiopaque foreign body. Electronically Signed   By: Tish Frederickson M.D.   On: 04/13/2023 14:04   DG Chest 2 View  Result Date: 04/13/2023 CLINICAL DATA:  Hypotension EXAM: CHEST - 2 VIEW COMPARISON:  Chest x-ray dated April 13, 2016 FINDINGS: The heart size and mediastinal contours are within  normal limits. Both lungs are clear. The visualized skeletal structures are unremarkable. IMPRESSION: No active cardiopulmonary disease. Electronically Signed   By: Allegra Lai M.D.   On: 04/13/2023 13:14      Medications Ordered in ED Medications  enoxaparin (LOVENOX) injection 40 mg (has no administration in time range)  lactated ringers infusion (has no administration in time range)  acetaminophen (TYLENOL) tablet 650 mg (has no administration in time  range)    Or  acetaminophen (TYLENOL) suppository 650 mg (has no administration in time range)  ondansetron (ZOFRAN) tablet 4 mg (has no administration in time range)    Or  ondansetron (ZOFRAN) injection 4 mg (has no administration in time range)  sodium chloride 0.9 % bolus 1,000 mL (0 mLs Intravenous Stopped 04/13/23 1514)  piperacillin-tazobactam (ZOSYN) IVPB 3.375 g (0 g Intravenous Stopped 04/13/23 1424)  fentaNYL (SUBLIMAZE) injection 50 mcg (50 mcg Intravenous Given 04/13/23 1519)  lactated ringers bolus 1,000 mL (1,000 mLs Intravenous New Bag/Given 04/13/23 1524)  ondansetron (ZOFRAN) injection 4 mg (4 mg Intravenous Given 04/13/23 1518)  acetaminophen (TYLENOL) tablet 1,000 mg (1,000 mg Oral Given 04/13/23 1525)    ED Course/ Medical Decision Making/ A&P                                Medical Decision Making Problems Addressed: Animal bite: complicated acute illness or injury  Amount and/or Complexity of Data Reviewed Labs: ordered. Radiology: ordered and independent interpretation performed. ECG/medicine tests: ordered and independent interpretation performed.  Risk OTC drugs. Prescription drug management. Decision regarding hospitalization.   Patient is an otherwise healthy 67 year old female presenting today for right leg pain, vomiting, and fevers beginning yesterday.  On exam, she is hypotensive upon arrival at 80/61.  She is alert and oriented.  She does have erythema with edema noted to her right lower  extremity associated with 3 small puncture wounds consistent with cat bite injury further described above.  Presentation concerning for sepsis related to cat bite, possibly resulting in cellulitis or abscess formation.  Will also evaluate for foreign body.  Additionally, given that symptoms would have progressed rapidly last 24 hours, will also evaluate for further sources of infection such as UTI and pneumonia.  She has no crepitus on palpation of her right leg.  Patient was initially started on 1 L IV fluids with improvement in her blood pressure.  EKG reveals sinus rhythm at 91 bpm with nonspecific ST changes.  Chest x-ray shows no acute cardiopulmonary disease.  X-ray of the right leg reveals no fracture or dislocation.  Subcutaneous soft tissue edema at the anterior midline without any retained foreign body.  Lab work reveals leukocytosis at 16.  Lactic acid is elevated 2.9.  Creatinine is elevated at 1.2 from baseline of 0.8.  Blood cultures were drawn and patient is started on Zosyn.  Additionally, blood pressure had improved however she is not tachycardic.  Will give an additional 1 L IV fluids.  She is additionally given Zofran for symptomatic control.  Will admit for concern of sepsis related to possible cellulitis from cat bite.  She is hemodynamically stable and appropriate for transfer to the floor.   Final Clinical Impression(s) / ED Diagnoses Final diagnoses:  Animal bite    Rx / DC Orders ED Discharge Orders     None         Rhys Martini, DO 04/13/23 1626    Eber Hong, MD 04/14/23 1227

## 2023-04-13 NOTE — ED Triage Notes (Addendum)
Pt came in via POV d/t vomiting green liquid since this morning, was bitten by her cat yesterday on her Rt shin & it is very painful & swollen. States that she feel achy, lightheaded & intermittent fevers, afebrile while in triage. Rates her pain 8/10 in her leg & 6/10 on her Rt shin area. Hypotensive while in triage 80/61.

## 2023-04-13 NOTE — H&P (Signed)
Hospital Admission History and Physical Service Pager: [phone]  Patient name: Shikha Sawdy Record Medical record number: [id number] Date of Birth: 02-11-1956 Age: 67 y.o. Gender: female  Primary Care Provider: Patient, No Pcp Per Consultants: None Code Status: Full which was confirmed with patient Preferred Emergency Contact:  Contact Information     Name Relation Home Work Mobile   Wombles,Mondonna Daughter   [phone]   Elenore Rota [phone]        Other Contacts   None on File    Chief Complaint: pain and shivering  Assessment and Plan: Dreyah Minkler Portugal is a 67 y.o. female presenting with nausea, vomiting, and pain to her R leg after a cat bite. Differential for presentation is sepsis due to a myriad of infections etiologies, including cellulitis, osteomyelitis, and GI viral infection. The patient was also found to have an acute kidney injury most likely due to dehydration in the setting of GI fluid loss.   [facility]     * (Principal) Sepsis [facility])     Presented with leukocytosis, hypotension, tachycardia, and an elevated  lactic acid. Afebrile but reports subjective fevers at home. Potential etiologies include cellulitis vs osteomyelitis from cat bite vs  viral GI illness vs other infectious process. Most likely cellulitis plus  viral gastroenteritis given physical exam findings and clinical  improvement with ABX and fluids. -maintenance fluids @ 122mL/hr LR -PRN zofran -continuous telemetry x24 hours -f/u blood cx -f/u repeat lactic acid -AM CBC        Animal bite     Indoor cat bite to the R shin on the morning of 8/11 Puncture wound with erythema, edema, pain, and heat Leukocytosis to 16.7 -1x zosyn in the ED -Tylenol for pain control -continue to monitor wound        AKI (acute kidney injury) (HCC)     Creatinine 1.24 from a baseline of 0.77-0.80 Most likely prerenal in the setting of fluid loss from diarrhea, vomiting,  and poor  PO intake Has received 2x 1L fluid boluses in the ED -fluids as above -AM BMP       FEN/GI: regular VTE Prophylaxis: LMWH 40mg   Disposition: admit to progressive  History of Present Illness:  Onella Prioleau Medici is a 67 y.o. female presenting with a cat bite to her R leg. She was bit by her cat on R shin yesterday morning. Last night, she noticed swelling and pain at the site and also reports subjective fevers, chills, and green, non-bloody vomiting.   Patient states that her house cat bite her yesterday. The cat is up to date on vaccinations. Last night, she began experiencing chills which then progressed to nausea/vomiting. Her vomit was green and non bloody. She took Advil 600mg  and tylenol 500mg . This morning, she felt very nauseous so only took Tylenol around 9am. She gets a lot of cuts and scrapes in general and has a high pain tolerance but this was unbearable. She felt very hot too but did not take her temperature. She is also experiencing diarrhea which began this morning and some difficulty urinating because "there isn't anything in me." Endorses belly pain, aching up to her hips. Also feels weak in her right leg and attributes this to her pain.  She is healthy otherwise. She states she last saw her PCP about 5 years ago.  In the ED, the patient was hypotensive to 80/61 initially and was found to have a lactic acid of  2.9 and leukocytosis to 16.7.  Her blood pressure subsequently improved. Physical exam was notable for erythema and 3 puncture wounds on the RLE, one anteriorly and two posteriorly. X-ray of her RLE showed subcutaneous soft tissue edema and no acute fracture or foreign body. She received 2L boluses of fluids and 1 dose of zosyn.    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   Pertinent Past Surgical History: Partial  Hysterectomy Remainder reviewed in history tab.  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   Important Outpatient Medications: Biotin NAC Vinegar and water Olive oil MCT oil Remainder reviewed in medication history.   Objective: BP 125/73   Pulse (!) 102   Temp 98.6 F (37 C) (Oral)   Resp 18   Ht 5\' 5"  (1.651 m)   Wt 65.8 kg   SpO2 100%   BMI 24.13 kg/m  Exam: General: middle aged female laying in bed in no acute distress, generalized shakiness Eyes: EOM intact, conjunctiva normal Cardiovascular: RRR, normal S1/S2, no murmurs, rubs, or gallops Respiratory: clear to auscultation bilaterally, normal WOB, normal breath sounds Gastrointestinal: decreased bowel sounds, soft, non tender, non distended Derm: 3-106mm puncture wound on R shin with surrounding erythema, warmth, edema, and tenderness (see photo) Neuro: moving all 4 extremities spontaneously, no focal deficits appreciated Psych: normal mood, affect, behavior     Labs:  CBC BMET  Recent Labs  Lab 04/13/23 1326  WBC 16.7*  HGB 12.5  HCT 39.1  PLT 242   Recent Labs  Lab 04/13/23 1326  NA 137  K 4.2  CL 103  CO2 21*  BUN 18  CREATININE 1.24*  GLUCOSE 113*  CALCIUM 9.6     Lactic Acid 2.9>2.7  EKG:   Sinus rhythm, no ST elevations  Imaging Studies Performed:  CXR -no active cardiopulmonary disease  X ray R tibia/fibula - no acute fracture or dislocation - subcutaneous soft tissue edema - no foreign body   Lorayne Bender, MD 04/13/2023, 4:34 PM PGY-1, [facility]  FPTS Intern pager: [phone], text pages welcome Secure chat group [facility] Teaching Service

## 2023-04-13 NOTE — Assessment & Plan Note (Addendum)
Indoor cat bite to the R shin on the morning of 8/11 Puncture wound with erythema, edema, pain, and heat Leukocytosis to 16.7 -1x zosyn in the ED -Tylenol for pain control -continue to monitor wound

## 2023-04-13 NOTE — Assessment & Plan Note (Signed)
>>  ASSESSMENT AND PLAN FOR ANIMAL BITE WRITTEN ON 04/13/2023  4:30 PM BY Lorayne Bender, MD  Indoor cat bite to the R shin on the morning of 8/11 Puncture wound with erythema, edema, pain, and heat Leukocytosis to 16.7 -1x zosyn in the ED -Tylenol for pain control -continue to monitor wound

## 2023-04-13 NOTE — ED Notes (Signed)
Patient taken to bathroom in wheelchair.

## 2023-04-14 DIAGNOSIS — A419 Sepsis, unspecified organism: Secondary | ICD-10-CM

## 2023-04-14 DIAGNOSIS — T148XXA Other injury of unspecified body region, initial encounter: Principal | ICD-10-CM

## 2023-04-14 DIAGNOSIS — R198 Other specified symptoms and signs involving the digestive system and abdomen: Secondary | ICD-10-CM | POA: Insufficient documentation

## 2023-04-14 LAB — SARS CORONAVIRUS 2 BY RT PCR: SARS Coronavirus 2 by RT PCR: NEGATIVE

## 2023-04-14 LAB — LACTIC ACID, PLASMA: Lactic Acid, Venous: 1.8 mmol/L (ref 0.5–1.9)

## 2023-04-14 MED ORDER — POLYETHYLENE GLYCOL 3350 17 G PO PACK
17.0000 g | PACK | Freq: Every day | ORAL | Status: DC
  Administered 2023-04-14: 17 g via ORAL
  Filled 2023-04-14 (×2): qty 1

## 2023-04-14 MED ORDER — MUSCLE RUB 10-15 % EX CREA
TOPICAL_CREAM | CUTANEOUS | Status: DC | PRN
  Filled 2023-04-14: qty 85

## 2023-04-14 MED ORDER — ENSURE ENLIVE PO LIQD
237.0000 mL | Freq: Two times a day (BID) | ORAL | Status: DC
  Administered 2023-04-14 – 2023-04-16 (×4): 237 mL via ORAL

## 2023-04-14 MED ORDER — SODIUM CHLORIDE 0.9 % IV SOLN
3.0000 g | Freq: Four times a day (QID) | INTRAVENOUS | Status: DC
  Administered 2023-04-14 – 2023-04-15 (×4): 3 g via INTRAVENOUS
  Filled 2023-04-14 (×5): qty 8

## 2023-04-14 NOTE — Assessment & Plan Note (Signed)
Patient complaining of vomiting green fluid for the past day or so with 1 bout of diarrhea at home.  She reports that she is nauseous today but less so than yesterday.  Likely viral gastroenteritis. - COVID swab -Zofran as needed - encouraged PO intake

## 2023-04-14 NOTE — Plan of Care (Signed)
  Problem: Education: Goal: Knowledge of General Education information will improve Description: Including pain rating scale, medication(s)/side effects and non-pharmacologic comfort measures 04/14/2023 0636 by Yvone Neu, RN Outcome: Progressing 04/14/2023 0554 by Yvone Neu, RN Outcome: Progressing   Problem: Health Behavior/Discharge Planning: Goal: Ability to manage health-related needs will improve 04/14/2023 0636 by Yvone Neu, RN Outcome: Progressing 04/14/2023 0554 by Yvone Neu, RN Outcome: Progressing   Problem: Clinical Measurements: Goal: Ability to maintain clinical measurements within normal limits will improve 04/14/2023 0636 by Yvone Neu, RN Outcome: Progressing 04/14/2023 0554 by Yvone Neu, RN Outcome: Progressing Goal: Will remain free from infection 04/14/2023 0636 by Yvone Neu, RN Outcome: Progressing 04/14/2023 0554 by Yvone Neu, RN Outcome: Progressing Goal: Diagnostic test results will improve 04/14/2023 0636 by Yvone Neu, RN Outcome: Progressing 04/14/2023 0554 by Yvone Neu, RN Outcome: Progressing Goal: Respiratory complications will improve 04/14/2023 0636 by Yvone Neu, RN Outcome: Progressing 04/14/2023 0554 by Yvone Neu, RN Outcome: Progressing Goal: Cardiovascular complication will be avoided 04/14/2023 0636 by Yvone Neu, RN Outcome: Progressing 04/14/2023 0554 by Yvone Neu, RN Outcome: Progressing   Problem: Activity: Goal: Risk for activity intolerance will decrease 04/14/2023 0636 by Yvone Neu, RN Outcome: Progressing 04/14/2023 0554 by Yvone Neu, RN Outcome: Progressing   Problem: Nutrition: Goal: Adequate nutrition will be maintained 04/14/2023 0636 by Yvone Neu, RN Outcome: Progressing 04/14/2023 0554 by Yvone Neu, RN Outcome: Progressing   Problem: Coping: Goal: Level of anxiety will decrease 04/14/2023 0636 by Yvone Neu, RN Outcome: Progressing 04/14/2023 0554 by Yvone Neu, RN Outcome: Progressing   Problem: Safety: Goal: Ability to remain free from injury will improve 04/14/2023 0636 by Yvone Neu, RN Outcome: Progressing 04/14/2023 0554 by Yvone Neu, RN Outcome: Progressing   Problem: Skin Integrity: Goal: Risk for impaired skin integrity will decrease 04/14/2023 0636 by Yvone Neu, RN Outcome: Progressing 04/14/2023 0554 by Yvone Neu, RN Outcome: Progressing   Problem: Fluid Volume: Goal: Hemodynamic stability will improve 04/14/2023 0636 by Yvone Neu, RN Outcome: Progressing 04/14/2023 0554 by Yvone Neu, RN Outcome: Progressing   Problem: Clinical Measurements: Goal: Diagnostic test results will improve 04/14/2023 0636 by Yvone Neu, RN Outcome: Progressing 04/14/2023 0554 by Yvone Neu, RN Outcome: Progressing Goal: Signs and symptoms of infection will decrease 04/14/2023 0636 by Yvone Neu, RN Outcome: Progressing 04/14/2023 0554 by Yvone Neu, RN Outcome: Progressing   Problem: Respiratory: Goal: Ability to maintain adequate ventilation will improve 04/14/2023 0636 by Yvone Neu, RN Outcome: Progressing 04/14/2023 0554 by Yvone Neu, RN Outcome: Progressing

## 2023-04-14 NOTE — Assessment & Plan Note (Addendum)
Pertinent increased from 1.24 to 1.69 from yesterday. Most likely prerenal in the setting of fluid loss from diarrhea, vomiting, and poor PO intake -Increased IVF from 100 cc/h to 150 cc/h - AM CMP

## 2023-04-14 NOTE — ED Notes (Signed)
Noted patients BP critically low of 76/51, patient symptomatic with low BP. Patient reports lightheaded, dizzy, short of breath.

## 2023-04-14 NOTE — Progress Notes (Addendum)
Daily Progress Note Intern Pager: [phone]  Patient name: Brenda [place] Medical record number: [id number] Date of birth: 1956-03-14 Age: 67 y.o. Gender: female  Primary Care Provider: Patient, No Pcp Per Consultants: None Code Status: Full  Pt Overview and Major Events to Date:  8/12- admitted  Assessment and Plan: Patient is a 67 year old female with no pertinent past medical history here for sepsis likely 2/2 to soft tissue infection.   -      Hospital     * (Principal) Sepsis [facility])     Patient presented with tachycardia, leukocytosis, hypotension  concerning for sepsis. Patient reports she is doing a lot better this  morning. Does have puncture wound with surrounding erythema and swelling  on right lower extremity.  - Bcx: NG12H, continue to f/u - concern for Pasteurella due to cat bite, will switch antibiotics from  Zosyn to Unasyn, if patient able to tolerate p.o., transition to p.o.  tomorrow -Scheduled tylenol with as needed oxy for pain control - continue to monitor wound - AM CBC        AKI (acute kidney injury) (HCC)     Pertinent increased from 1.24 to 1.69 from yesterday. Most likely  prerenal in the setting of fluid loss from diarrhea, vomiting, and poor PO  intake -Increased IVF from 100 cc/h to 150 cc/h - AM CMP        Nausea, Vomitting, Diarrhea     Patient complaining of vomiting green fluid for the past day or so with  1 bout of diarrhea at home.  She reports that she is nauseous today but  less so than yesterday.  Likely viral gastroenteritis. - COVID swab -Zofran as needed - encouraged PO intake       FEN/GI: Regular regular PPx: Lovenox Dispo:Home pending clinical improvement .  Barriers include clinical improvement.  Subjective:  Patient reports that she is doing a lot better this morning.  She reports that she has no more fevers and chills and is not feeling as weak as she was yesterday.  She reports that she has been getting some  medicine for nausea and does not feel nauseous right now.  She reports that she has eaten some yesterday and today and has had no bouts of diarrhea while in the hospital.  She reports that she has had some gas but has not been able to go to the bathroom yet.  She states that her right leg is still hurting and is hot and red however she says it does feel little bit better than yesterday.  Objective: Temp:  [97.7 F (36.5 C)-99.6 F (37.6 C)] 97.8 F (36.6 C) (08/13 1125) Pulse Rate:  [57-104] 80 (08/13 1125) Resp:  [10-23] 19 (08/13 1125) BP: (76-133)/(51-80) 104/60 (08/13 1125) SpO2:  [93 %-100 %] 94 % (08/13 1125) Weight:  [65.8 kg-70.2 kg] 70.2 kg (08/13 3086) Physical Exam: General: Laying in bed comfortably, no acute distress Cardiovascular: RRR, no M/R/G Respiratory: Normal work of breathing on room air, CTAB Abdomen: Soft nontender nondistended, normal active to slightly hyperactive bowel sounds Extremities: Puncture mark on right shin.  Erythema, warmth, redness along right lower extremity.  Tender to touch.  Swelling extends from foot up to knee .     Most recent CBC Lab Results  Component Value Date   WBC 18.0 (H) 04/14/2023   HGB 10.1 (L) 04/14/2023   HCT 33.1 (L) 04/14/2023   MCV 96.8 04/14/2023   PLT 189 04/14/2023   Most  recent BMP    Latest Ref Rng & Units 04/14/2023    2:38 AM  BMP  Glucose 70 - 99 mg/dL 540   BUN 8 - 23 mg/dL 25   Creatinine 9.81 - 1.00 mg/dL 1.91   Sodium 478 - 295 mmol/L 134   Potassium 3.5 - 5.1 mmol/L 4.9   Chloride 98 - 111 mmol/L 106   CO2 22 - 32 mmol/L 17   Calcium 8.9 - 10.3 mg/dL 8.2    Lactic acid: 2.7 >>2.1>>2.5>> 1.8  Imaging/Diagnostic Tests: Xray R Tib/fib:  IMPRESSION: 1. No acute displaced fracture or dislocation. 2. Subcutaneus soft tissue edema of the anterior mid leg. No retained radiopaque foreign body. Georg Ruddle Micki Cassel, MD 04/14/2023, 12:41 PM  PGY-1, [facility] FPTS Intern pager: [phone],  text pages welcome Secure chat group [facility] Teaching Service

## 2023-04-14 NOTE — ED Notes (Signed)
PT is still c/o of pain in right leg, there is a PRN for oxycodone but Im holding off due to her being hypotensive.

## 2023-04-14 NOTE — ED Notes (Signed)
ED TO INPATIENT HANDOFF REPORT  ED Nurse Name and Phone #: Pearletha Forge RN [phone]  S Name/Age/Gender Brenda Austin 67 y.o. female Room/Bed: 037C/037C  Code Status   Code Status: Full Code  Home/SNF/Other Home Patient oriented to: self, place, time, and situation Is this baseline? Yes   Triage Complete: Triage complete  Chief Complaint Sepsis [facility]) [A41.9]  Triage Note Pt came in via POV d/t vomiting green liquid since this morning, was bitten by her cat yesterday on her Rt shin & it is very painful & swollen. States that she feel achy, lightheaded & intermittent fevers, afebrile while in triage. Rates her pain 8/10 in her leg & 6/10 on her Rt shin area. Hypotensive while in triage 80/61.   Allergies Allergies  Allergen Reactions   Clindamycin/Lincomycin Nausea And Vomiting   Codeine Sulfate    Tape Rash    Level of Care/Admitting Diagnosis ED Disposition     ED Disposition  Admit   Condition  --   Comment  Hospital Area: [facility] [[id number]]  Level of Care: Progressive [102]  Admit to Progressive based on following criteria: MULTISYSTEM THREATS such as stable sepsis, metabolic/electrolyte imbalance with or without encephalopathy that is responding to early treatment.  May admit patient to [facility] or [facility] if equivalent level of care is available:: No  Covid Evaluation: Asymptomatic - no recent exposure (last 10 days) testing not required  Diagnosis: Sepsis [facility]) [[id number]]  Admitting Physician: Lorayne Bender [[id number]]  Attending Physician: Nestor Ramp [[id number]]  Certification:: I certify this patient will need inpatient services for at least 2 midnights  Estimated Length of Stay: 2          B Medical/Surgery History Past Medical History:  Diagnosis Date   Basal cell carcinoma    Horseshoe kidney    Kidney stone    Past Surgical History:  Procedure Laterality Date   LIPOMA EXCISION     PARTIAL HYSTERECTOMY       A IV  Location/Drains/Wounds Patient Lines/Drains/Airways Status     Active Line/Drains/Airways     Name Placement date Placement time Site Days   Peripheral IV 04/13/23 20 G Left Antecubital 04/13/23  1330  Antecubital  1   Peripheral IV 04/13/23 20 G 1" Anterior;Proximal;Right Antecubital 04/13/23  1341  Antecubital  1            Intake/Output Last 24 hours  Intake/Output Summary (Last 24 hours) at 04/14/2023 0449 Last data filed at 04/14/2023 0435 Gross per 24 hour  Intake 1239.34 ml  Output --  Net 1239.34 ml    Labs/Imaging Results for orders placed or performed during the hospital encounter of 04/13/23 (from the past 48 hour(s))  Urinalysis, Routine w reflex microscopic -Urine, Clean Catch     Status: None   Collection Time: 04/13/23 12:39 PM  Result Value Ref Range   Color, Urine YELLOW YELLOW   APPearance CLEAR CLEAR   Specific Gravity, Urine 1.017 1.005 - 1.030   pH 5.0 5.0 - 8.0   Glucose, UA NEGATIVE NEGATIVE mg/dL   Hgb urine dipstick NEGATIVE NEGATIVE   Bilirubin Urine NEGATIVE NEGATIVE   Ketones, ur NEGATIVE NEGATIVE mg/dL   Protein, ur NEGATIVE NEGATIVE mg/dL   Nitrite NEGATIVE NEGATIVE   Leukocytes,Ua NEGATIVE NEGATIVE    Comment: Performed at [facility], [address], [place], [place] [address]  Comprehensive metabolic panel     Status: Abnormal   Collection Time: 04/13/23 12:45 PM  Result  Value Ref Range   Sodium 135 135 - 145 mmol/L   Potassium 4.0 3.5 - 5.1 mmol/L   Chloride 102 98 - 111 mmol/L   CO2 22 22 - 32 mmol/L   Glucose, Bld 127 (H) 70 - 99 mg/dL    Comment: Glucose reference range applies only to samples taken after fasting for at least 8 hours.   BUN 17 8 - 23 mg/dL   Creatinine, Ser 9.81 (H) 0.44 - 1.00 mg/dL   Calcium 9.4 8.9 - 19.1 mg/dL   Total Protein 5.9 (L) 6.5 - 8.1 g/dL   Albumin 3.2 (L) 3.5 - 5.0 g/dL   AST 25 15 - 41 U/L   ALT 19 0 - 44 U/L   Alkaline Phosphatase 49 38 - 126 U/L   Total Bilirubin 2.4 (H) 0.3 - 1.2  mg/dL   GFR, Estimated 50 (L) >60 mL/min    Comment: (NOTE) Calculated using the CKD-EPI Creatinine Equation (2021)    Anion gap 11 5 - 15    Comment: Performed at [facility], [address] [place], [place] [address]  Lipase, blood     Status: None   Collection Time: 04/13/23 12:45 PM  Result Value Ref Range   Lipase 28 11 - 51 U/L    Comment: Performed at [facility], [address], [place], [place] [address]  I-Stat Lactic Acid, ED     Status: Abnormal   Collection Time: 04/13/23  1:19 PM  Result Value Ref Range   Lactic Acid, Venous 2.9 (HH) 0.5 - 1.9 mmol/L   Comment NOTIFIED PHYSICIAN   Comprehensive metabolic panel     Status: Abnormal   Collection Time: 04/13/23  1:26 PM  Result Value Ref Range   Sodium 137 135 - 145 mmol/L   Potassium 4.2 3.5 - 5.1 mmol/L   Chloride 103 98 - 111 mmol/L   CO2 21 (L) 22 - 32 mmol/L   Glucose, Bld 113 (H) 70 - 99 mg/dL    Comment: Glucose reference range applies only to samples taken after fasting for at least 8 hours.   BUN 18 8 - 23 mg/dL   Creatinine, Ser 0.86 (H) 0.44 - 1.00 mg/dL   Calcium 9.6 8.9 - 57.8 mg/dL   Total Protein 6.2 (L) 6.5 - 8.1 g/dL   Albumin 3.4 (L) 3.5 - 5.0 g/dL   AST 29 15 - 41 U/L   ALT 19 0 - 44 U/L   Alkaline Phosphatase 54 38 - 126 U/L   Total Bilirubin 2.5 (H) 0.3 - 1.2 mg/dL   GFR, Estimated 48 (L) >60 mL/min    Comment: (NOTE) Calculated using the CKD-EPI Creatinine Equation (2021)    Anion gap 13 5 - 15    Comment: Performed at [facility], [address] [place], [place] [address]  CBC with Differential     Status: Abnormal   Collection Time: 04/13/23  1:26 PM  Result Value Ref Range   WBC 16.7 (H) 4.0 - 10.5 K/uL   RBC 4.25 3.87 - 5.11 MIL/uL   Hemoglobin 12.5 12.0 - 15.0 g/dL   HCT 95.2 84.1 - 32.4 %   MCV 92.0 80.0 - 100.0 fL   MCH 29.4 26.0 - 34.0 pg   MCHC 32.0 30.0 - 36.0 g/dL   RDW 40.1 02.7 - 25.3 %   Platelets 242 150 - 400 K/uL   nRBC 0.0 0.0 - 0.2 %    Neutrophils Relative % 88 %  Neutro Abs 14.9 (H) 1.7 - 7.7 K/uL   Lymphocytes Relative 4 %   Lymphs Abs 0.6 (L) 0.7 - 4.0 K/uL   Monocytes Relative 6 %   Monocytes Absolute 0.9 0.1 - 1.0 K/uL   Eosinophils Relative 0 %   Eosinophils Absolute 0.0 0.0 - 0.5 K/uL   Basophils Relative 0 %   Basophils Absolute 0.0 0.0 - 0.1 K/uL   Immature Granulocytes 2 %   Abs Immature Granulocytes 0.30 (H) 0.00 - 0.07 K/uL    Comment: Performed at [facility], [address], [place], [place] [address]  Protime-INR     Status: None   Collection Time: 04/13/23  1:26 PM  Result Value Ref Range   Prothrombin Time 14.3 11.4 - 15.2 seconds   INR 1.1 0.8 - 1.2    Comment: (NOTE) INR goal varies based on device and disease states. Performed at [facility], [address], [place], [place] [address]   APTT     Status: None   Collection Time: 04/13/23  1:26 PM  Result Value Ref Range   aPTT 31 24 - 36 seconds    Comment: Performed at [facility], [address], [place], [place] [address]  I-Stat Lactic Acid, ED     Status: Abnormal   Collection Time: 04/13/23  3:26 PM  Result Value Ref Range   Lactic Acid, Venous 2.7 (HH) 0.5 - 1.9 mmol/L   Comment NOTIFIED PHYSICIAN   HIV Antibody (routine testing w rflx)     Status: None   Collection Time: 04/13/23  4:39 PM  Result Value Ref Range   HIV Screen 4th Generation wRfx Non Reactive Non Reactive    Comment: Performed at [facility], [address] [place], [place] [address]  Lactic acid, plasma     Status: Abnormal   Collection Time: 04/13/23  5:48 PM  Result Value Ref Range   Lactic Acid, Venous 2.1 (HH) 0.5 - 1.9 mmol/L    Comment: CRITICAL RESULT CALLED TO, READ BACK BY AND VERIFIED WITH E RAYMOND,RN 1904 04/13/2023 WBOND Performed at [facility], [address], [place], [place] [address]   Lactic acid, plasma     Status: Abnormal   Collection Time: 04/13/23  8:37 PM  Result Value Ref Range   Lactic Acid,  Venous 2.5 (HH) 0.5 - 1.9 mmol/L    Comment: CRITICAL RESULT CALLED TO, READ BACK BY AND VERIFIED WITH Wandra Mannan, RN. 2111 04/13/23. LPAIT Performed at [facility], [address] [place], [place] [address]   Basic metabolic panel     Status: Abnormal   Collection Time: 04/14/23  2:38 AM  Result Value Ref Range   Sodium 134 (L) 135 - 145 mmol/L   Potassium 4.9 3.5 - 5.1 mmol/L   Chloride 106 98 - 111 mmol/L   CO2 17 (L) 22 - 32 mmol/L   Glucose, Bld 107 (H) 70 - 99 mg/dL    Comment: Glucose reference range applies only to samples taken after fasting for at least 8 hours.   BUN 25 (H) 8 - 23 mg/dL   Creatinine, Ser 5.78 (H) 0.44 - 1.00 mg/dL   Calcium 8.2 (L) 8.9 - 10.3 mg/dL   GFR, Estimated 33 (L) >60 mL/min    Comment: (NOTE) Calculated using the CKD-EPI Creatinine Equation (2021)    Anion gap 11 5 - 15    Comment: Performed at [facility], [address] [place], [place] [address]  CBC     Status: Abnormal   Collection Time: 04/14/23  2:38 AM  Result Value Ref Range   WBC 18.0 (H) 4.0 - 10.5 K/uL   RBC 3.42 (L) 3.87 - 5.11 MIL/uL   Hemoglobin 10.1 (L) 12.0 - 15.0 g/dL   HCT 60.4 (L) 54.0 - 98.1 %   MCV 96.8 80.0 - 100.0 fL   MCH 29.5 26.0 - 34.0 pg   MCHC 30.5 30.0 - 36.0 g/dL   RDW 19.1 47.8 - 29.5 %   Platelets 189 150 - 400 K/uL   nRBC 0.0 0.0 - 0.2 %    Comment: Performed at [facility], [address] [place], [place] [address]   DG Tibia/Fibula Right  Result Date: 04/13/2023 CLINICAL DATA:  cat bite EXAM: RIGHT TIBIA AND FIBULA - 2 VIEW COMPARISON:  None Available. FINDINGS: There is no evidence of fracture or other focal bone lesions. Subcutaneus soft tissue edema of the anterior mid leg. No retained radiopaque foreign body. Dermal calcification peer IMPRESSION: 1. No acute displaced fracture or dislocation. 2. Subcutaneus soft tissue edema of the anterior mid leg. No retained radiopaque foreign body. Electronically Signed   By: Tish Frederickson M.D.   On: 04/13/2023 14:04   DG Chest 2 View  Result Date: 04/13/2023 CLINICAL DATA:  Hypotension EXAM: CHEST - 2 VIEW COMPARISON:  Chest x-ray dated April 13, 2016 FINDINGS: The heart size and mediastinal contours are within normal limits. Both lungs are clear. The visualized skeletal structures are unremarkable. IMPRESSION: No active cardiopulmonary disease. Electronically Signed   By: Allegra Lai M.D.   On: 04/13/2023 13:14    Pending Labs Unresulted Labs (From admission, onward)     Start     Ordered   04/14/23 0500  Lactic acid, plasma  (Lactic Acid)  Tomorrow morning,   R        04/14/23 0333   04/13/23 1318  Blood Culture (routine x 2)  (Septic presentation on arrival (screening labs, nursing and treatment orders for obvious sepsis))  BLOOD CULTURE X 2,   STAT      04/13/23 1319   04/13/23 1239  CBC with Differential  Once,   STAT        04/13/23 1239            Vitals/Pain Today's Vitals   04/14/23 0330 04/14/23 0345 04/14/23 0400 04/14/23 0415  BP: (!) 85/56 (!) 86/64 (!) 91/57 (!) 86/57  Pulse: 73 76 72 73  Resp: 16 10 16 10   Temp:      TempSrc:      SpO2: 99% 98% 96% 99%  Weight:      Height:      PainSc:        Isolation Precautions No active isolations  Medications Medications  enoxaparin (LOVENOX) injection 40 mg (has no administration in time range)  lactated ringers infusion ( Intravenous Infusion Verify 04/14/23 0435)  ondansetron (ZOFRAN) tablet 4 mg (has no administration in time range)    Or  ondansetron (ZOFRAN) injection 4 mg (has no administration in time range)  acetaminophen (TYLENOL) tablet 650 mg (650 mg Oral Given 04/14/23 0057)    Or  acetaminophen (TYLENOL) suppository 650 mg ( Rectal See Alternative 04/14/23 0057)  oxyCODONE (Oxy IR/ROXICODONE) immediate release tablet 5 mg (5 mg Oral Given 04/13/23 1918)  piperacillin-tazobactam (ZOSYN) IVPB 3.375 g (0 g Intravenous Stopped 04/13/23 2233)  sodium chloride 0.9 % bolus 1,000  mL (0 mLs Intravenous Stopped 04/13/23 1514)  piperacillin-tazobactam (ZOSYN) IVPB 3.375 g (0 g Intravenous Stopped 04/13/23 1424)  fentaNYL (SUBLIMAZE) injection 50 mcg (50 mcg Intravenous Given 04/13/23 1519)  lactated ringers bolus 1,000 mL (0 mLs Intravenous Stopped 04/13/23 1638)  ondansetron (ZOFRAN) injection 4 mg (4 mg Intravenous Given 04/13/23 1518)  acetaminophen (TYLENOL) tablet 1,000 mg (1,000 mg Oral Given 04/13/23 1525)    Mobility walks     Focused Assessments Patient bitten by cat at home on the right ankle. Right ankle swelling, redness, tenderness, and weeping. Unable to bair weight on this leg due to pain.    R Recommendations: See Admitting Provider Note  Report given to:   Additional Notes: Patient has remained hypotensive through the night, MD is aware.

## 2023-04-14 NOTE — ED Notes (Signed)
Shift report received from [facility], Assumed care of patient at this time.

## 2023-04-14 NOTE — Plan of Care (Signed)
  Problem: Education: Goal: Knowledge of General Education information will improve Description: Including pain rating scale, medication(s)/side effects and non-pharmacologic comfort measures Outcome: Progressing   Problem: Health Behavior/Discharge Planning: Goal: Ability to manage health-related needs will improve Outcome: Progressing   Problem: Clinical Measurements: Goal: Ability to maintain clinical measurements within normal limits will improve Outcome: Progressing Goal: Will remain free from infection Outcome: Progressing Goal: Diagnostic test results will improve Outcome: Progressing Goal: Respiratory complications will improve Outcome: Progressing Goal: Cardiovascular complication will be avoided Outcome: Progressing   Problem: Activity: Goal: Risk for activity intolerance will decrease Outcome: Progressing   Problem: Nutrition: Goal: Adequate nutrition will be maintained Outcome: Progressing   Problem: Coping: Goal: Level of anxiety will decrease Outcome: Progressing   Problem: Safety: Goal: Ability to remain free from injury will improve Outcome: Progressing   Problem: Skin Integrity: Goal: Risk for impaired skin integrity will decrease Outcome: Progressing   Problem: Fluid Volume: Goal: Hemodynamic stability will improve Outcome: Progressing   Problem: Clinical Measurements: Goal: Diagnostic test results will improve Outcome: Progressing Goal: Signs and symptoms of infection will decrease Outcome: Progressing   Problem: Respiratory: Goal: Ability to maintain adequate ventilation will improve Outcome: Progressing

## 2023-04-14 NOTE — Assessment & Plan Note (Signed)
Cat bite on 8/11. Indoor cat bite to the R shin on the morning of 8/11. Xray showed subcutaneus soft tissue edema of the anterior mid leg- concerning for cellulitis. Leukocytes up 18.0 from 16.7 this AM.   -continue IV zosyn q8h -Tylenol q6h and oxycodone 5 mg q6h prn  -continue to monitor wound

## 2023-04-14 NOTE — Evaluation (Signed)
Physical Therapy Evaluation Patient Details Name: Brenda Austin MRN: [id number] DOB: 1955-11-30 Today's Date: 04/14/2023  History of Present Illness  Pt is a 67 y.o. F who presents 04/13/2023 with nausea, vomiting, and pain to RLE after a cat bite. Admitted with sepsis. Significant PMH: none.  Clinical Impression  PTA, pt lives alone in a mobile home with 2 steps to enter and works an office job. Pt presents with decreased functional mobility secondary to distal RLE weakness, decreased ROM, and gait abnormalities. Pt ambulating 25 ft with a walker at a supervision level. Demonstrates decreased ability to weight bear through RLE; able to progressively achieve foot flat on ground halfway through walk. Reports 7/10 pain. Encouraged right ankle ROM (I.e. "ABC's") throughout day and continued elevation. Pt would benefit from OPPT at d/c to address deficits.      If plan is discharge home, recommend the following: Assistance with cooking/housework;Assist for transportation;Help with stairs or ramp for entrance   Can travel by private vehicle        Equipment Recommendations Rolling walker (2 wheels)  Recommendations for Other Services       Functional Status Assessment Patient has had a recent decline in their functional status and demonstrates the ability to make significant improvements in function in a reasonable and predictable amount of time.     Precautions / Restrictions Precautions Precautions: Fall Precaution Comments: low fall risk Restrictions Weight Bearing Restrictions: No      Mobility  Bed Mobility Overal bed mobility: Modified Independent                  Transfers Overall transfer level: Needs assistance Equipment used: Rolling walker (2 wheels) Transfers: Sit to/from Stand Sit to Stand: Supervision                Ambulation/Gait Ambulation/Gait assistance: Supervision Gait Distance (Feet): 25 Feet Assistive device: Rolling walker (2  wheels) Gait Pattern/deviations: Step-to pattern, Decreased stance time - right, Decreased step length - right, Decreased dorsiflexion - right Gait velocity: decreased Gait velocity interpretation: <1.31 ft/sec, indicative of household ambulator   General Gait Details: Verbal cues for walker use, sequencing, technique. Pt progressing to R foot flat on ground. Limited weightbearing, heavy use of BUE's  Stairs            Wheelchair Mobility     Tilt Bed    Modified Rankin (Stroke Patients Only)       Balance Overall balance assessment: Needs assistance Sitting-balance support: Feet supported Sitting balance-Leahy Scale: Good     Standing balance support: Bilateral upper extremity supported Standing balance-Leahy Scale: Poor Standing balance comment: reliant on BUE support                             Pertinent Vitals/Pain Pain Assessment Pain Assessment: 0-10 Pain Score: 7  Pain Location: RLE Pain Descriptors / Indicators: Tightness, Grimacing, Guarding Pain Intervention(s): Limited activity within patient's tolerance, Monitored during session    Home Living Family/patient expects to be discharged to:: Private residence Living Arrangements: Alone Available Help at Discharge: Family Type of Home: Mobile home Home Access: Stairs to enter Entrance Stairs-Rails: Doctor, general practice of Steps: 2   Home Layout: One level Home Equipment: None Additional Comments: Has chickens, outside dog/cat, 2 inside cats    Prior Function Prior Level of Function : Independent/Modified Independent             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  Hip/knee ROM WFL. Ankle eversion limited to neutral, tendency to rest in inversion. Grossly 2-/5 ankle dorsiflexion    Cervical / Trunk Assessment Cervical / Trunk Assessment: Normal  Communication   Communication Communication: No apparent difficulties  Cognition Arousal: Alert Behavior During Therapy: WFL for tasks assessed/performed Overall Cognitive Status: Within Functional Limits for tasks assessed                                          General Comments      Exercises     Assessment/Plan    PT Assessment Patient needs continued PT services  PT Problem List Decreased strength;Decreased activity tolerance;Decreased balance;Decreased mobility;Pain       PT Treatment Interventions DME instruction;Gait training;Stair training;Functional mobility training;Therapeutic activities;Balance training;Therapeutic exercise;Patient/family education    PT Goals (Current goals can be found in the Care Plan section)  Acute Rehab PT Goals Patient Stated Goal: to walk better PT Goal Formulation: With patient Time For Goal Achievement: 04/28/23 Potential to Achieve Goals: Good    Frequency Min 1X/week     Co-evaluation               AM-PAC PT "6 Clicks" Mobility  Outcome Measure Help needed turning from your back to your side while in a flat bed without using bedrails?: None Help needed moving from lying on your back to sitting on the side of a flat bed without using bedrails?: None Help needed moving to and from a bed to a chair (including a wheelchair)?: A Little Help needed standing up from a chair using your arms (e.g., wheelchair or bedside chair)?: A Little Help needed to walk in hospital room?: A Little Help needed climbing 3-5 steps with a railing? : A Little 6 Click Score: 20    End of Session   Activity Tolerance: Patient tolerated treatment well Patient left: in bed;with call bell/phone within reach Nurse Communication: Mobility status PT  Visit Diagnosis: Other abnormalities of gait and mobility (R26.89);Difficulty in walking, not elsewhere classified (R26.2);Pain Pain - Right/Left: Right Pain - part of body: Leg    Time: [id number] PT Time Calculation (min) (ACUTE ONLY): 21 min   Charges:   PT Evaluation $PT Eval Low Complexity: 1 Low   PT General Charges $$ ACUTE PT VISIT: 1 Visit         Lillia Pauls, PT, DPT Acute Rehabilitation Services Office [phone]   Norval Morton 04/14/2023, 4:57 PM

## 2023-04-14 NOTE — ED Notes (Addendum)
Gave patient 250 mL LR for BP 76/51, patients BP increased to 90/60. Continue to wait for response from Attending.

## 2023-04-14 NOTE — ED Notes (Signed)
Paged attending about critical low BP 76/59

## 2023-04-14 NOTE — Plan of Care (Signed)
Problem: Education: Goal: Knowledge of General Education information will improve Description: Including pain rating scale, medication(s)/side effects and non-pharmacologic comfort measures 04/14/2023 0636 by Yvone Neu, RN Outcome: Progressing 04/14/2023 0636 by Yvone Neu, RN Outcome: Progressing 04/14/2023 0554 by Yvone Neu, RN Outcome: Progressing   Problem: Health Behavior/Discharge Planning: Goal: Ability to manage health-related needs will improve 04/14/2023 0636 by Yvone Neu, RN Outcome: Progressing 04/14/2023 0636 by Yvone Neu, RN Outcome: Progressing 04/14/2023 0554 by Yvone Neu, RN Outcome: Progressing   Problem: Clinical Measurements: Goal: Ability to maintain clinical measurements within normal limits will improve 04/14/2023 0636 by Yvone Neu, RN Outcome: Progressing 04/14/2023 0636 by Yvone Neu, RN Outcome: Progressing 04/14/2023 0554 by Yvone Neu, RN Outcome: Progressing Goal: Will remain free from infection 04/14/2023 0636 by Yvone Neu, RN Outcome: Progressing 04/14/2023 0636 by Yvone Neu, RN Outcome: Progressing 04/14/2023 0554 by Yvone Neu, RN Outcome: Progressing Goal: Diagnostic test results will improve 04/14/2023 0636 by Yvone Neu, RN Outcome: Progressing 04/14/2023 0636 by Yvone Neu, RN Outcome: Progressing 04/14/2023 0554 by Yvone Neu, RN Outcome: Progressing Goal: Respiratory complications will improve 04/14/2023 0636 by Yvone Neu, RN Outcome: Progressing 04/14/2023 0636 by Yvone Neu, RN Outcome: Progressing 04/14/2023 0554 by Yvone Neu, RN Outcome: Progressing Goal: Cardiovascular complication will be avoided 04/14/2023 0636 by Yvone Neu, RN Outcome: Progressing 04/14/2023 0636 by Yvone Neu, RN Outcome: Progressing 04/14/2023 0554 by Yvone Neu, RN Outcome: Progressing   Problem: Activity: Goal: Risk for  activity intolerance will decrease 04/14/2023 0636 by Yvone Neu, RN Outcome: Progressing 04/14/2023 0636 by Yvone Neu, RN Outcome: Progressing 04/14/2023 0554 by Yvone Neu, RN Outcome: Progressing   Problem: Nutrition: Goal: Adequate nutrition will be maintained 04/14/2023 0636 by Yvone Neu, RN Outcome: Progressing 04/14/2023 0636 by Yvone Neu, RN Outcome: Progressing 04/14/2023 0554 by Yvone Neu, RN Outcome: Progressing   Problem: Coping: Goal: Level of anxiety will decrease 04/14/2023 0636 by Yvone Neu, RN Outcome: Progressing 04/14/2023 0636 by Yvone Neu, RN Outcome: Progressing 04/14/2023 0554 by Yvone Neu, RN Outcome: Progressing   Problem: Safety: Goal: Ability to remain free from injury will improve 04/14/2023 0636 by Yvone Neu, RN Outcome: Progressing 04/14/2023 0636 by Yvone Neu, RN Outcome: Progressing 04/14/2023 0554 by Yvone Neu, RN Outcome: Progressing   Problem: Skin Integrity: Goal: Risk for impaired skin integrity will decrease 04/14/2023 0636 by Yvone Neu, RN Outcome: Progressing 04/14/2023 0636 by Yvone Neu, RN Outcome: Progressing 04/14/2023 0554 by Yvone Neu, RN Outcome: Progressing   Problem: Fluid Volume: Goal: Hemodynamic stability will improve 04/14/2023 0636 by Yvone Neu, RN Outcome: Progressing 04/14/2023 0636 by Yvone Neu, RN Outcome: Progressing 04/14/2023 0554 by Yvone Neu, RN Outcome: Progressing   Problem: Clinical Measurements: Goal: Diagnostic test results will improve 04/14/2023 0636 by Yvone Neu, RN Outcome: Progressing 04/14/2023 0636 by Yvone Neu, RN Outcome: Progressing 04/14/2023 0554 by Yvone Neu, RN Outcome: Progressing Goal: Signs and symptoms of infection will decrease 04/14/2023 0636 by Yvone Neu, RN Outcome: Progressing 04/14/2023 0636 by Yvone Neu, RN Outcome:  Progressing 04/14/2023 0554 by Yvone Neu, RN Outcome: Progressing   Problem: Respiratory: Goal: Ability to maintain adequate ventilation will improve 04/14/2023 0636 by Yvone Neu, RN Outcome: Progressing 04/14/2023 0636 by Yvone Neu, RN Outcome: Progressing 04/14/2023 0554 by  Jasmina Gendron, Ernestene Kiel, RN Outcome: Progressing

## 2023-04-14 NOTE — ED Notes (Signed)
Paged attending about patients critical BP of 76/51 also messaged attending. No response at this time. Primary RN Rodney Booze made aware. Placed patient on cardiac monitor.

## 2023-04-14 NOTE — Assessment & Plan Note (Addendum)
Patient presented with tachycardia, leukocytosis, hypotension concerning for sepsis. Patient reports she is doing a lot better this morning. Does have puncture wound with surrounding erythema and swelling on right lower extremity. - concern for Pasteurella due to cat bite, will switch antibiotics from Zosyn to Unasyn, if patient able to tolerate p.o., transition to p.o. tomorrow -Scheduled tylenol with as needed oxy for pain control - continue to monitor wound - AM CBC

## 2023-04-14 NOTE — Assessment & Plan Note (Addendum)
>>  ASSESSMENT AND PLAN FOR SEPSIS (HCC) WRITTEN ON 04/14/2023  8:11 AM BY Rudi Knippenberg, MD  Lactic acid downtrended to normal overnight.  Leukocytosis up from 16.7-18.0 this morning.  Xray showed soft tissue edema- concerning for cellulitis.  Patient afebrile overnight. -maintenance fluids @ 1110mL/hr LR -continuous telemetry x24 hours -f/u blood cx   >>ASSESSMENT AND PLAN FOR ANIMAL BITE WRITTEN ON 04/14/2023  8:08 AM BY Penne Lash, MD  Cat bite on 8/11. Indoor cat bite to the R shin on the morning of 8/11. Xray showed subcutaneus soft tissue edema of the anterior mid leg- concerning for cellulitis. Leukocytes up 18.0 from 16.7 this AM.   -continue IV zosyn q8h -Tylenol q6h and oxycodone 5 mg q6h prn  -continue to monitor wound

## 2023-04-14 NOTE — ED Notes (Addendum)
PT's BP is holding in the 80/56 (67) range.Physician Jerre Simon MD notified and responded that he will see what her next set of vitals look like.

## 2023-04-14 NOTE — Assessment & Plan Note (Signed)
Patient received 1 dose of Zosyn in the ED. She was transitioned to Unasyn yesterday.  Patient remained afebrile and normotensive overnight. Leukocytosis improved from 16.7 to 12.8. Patient reports she is feeling better - Bcx: NG>24H, continue to f/u - transition to PO augmentin today, d/c unasyn - d/c IVF -scheduled meloxicam 15mg  with with oxy prn for pain control - continue to monitor wound - AM CBC

## 2023-04-14 NOTE — ED Notes (Signed)
Pt assisted with using the bedpan.

## 2023-04-15 DIAGNOSIS — A419 Sepsis, unspecified organism: Secondary | ICD-10-CM | POA: Diagnosis not present

## 2023-04-15 DIAGNOSIS — T148XXA Other injury of unspecified body region, initial encounter: Secondary | ICD-10-CM | POA: Diagnosis not present

## 2023-04-15 MED ORDER — AMOXICILLIN-POT CLAVULANATE 875-125 MG PO TABS
1.0000 | ORAL_TABLET | Freq: Two times a day (BID) | ORAL | Status: DC
  Administered 2023-04-15 – 2023-04-16 (×2): 1 via ORAL
  Filled 2023-04-15 (×3): qty 1

## 2023-04-15 MED ORDER — POLYETHYLENE GLYCOL 3350 17 G PO PACK: 17.0000 g | PACK | Freq: Every day | ORAL | Status: DC | PRN

## 2023-04-15 MED ORDER — OXYCODONE HCL 5 MG PO TABS
5.0000 mg | ORAL_TABLET | Freq: Three times a day (TID) | ORAL | Status: DC | PRN
  Administered 2023-04-16 (×2): 5 mg via ORAL
  Filled 2023-04-15 (×2): qty 1

## 2023-04-15 MED ORDER — MELOXICAM 7.5 MG PO TABS
15.0000 mg | ORAL_TABLET | Freq: Every day | ORAL | Status: DC
  Administered 2023-04-15 – 2023-04-16 (×2): 15 mg via ORAL
  Filled 2023-04-15 (×2): qty 2

## 2023-04-15 NOTE — Discharge Instructions (Signed)
Dear Brenda Austin,   Thank you so much for allowing [place] to be part of your care!  You were admitted to [facility] for sepsis (an intense infection) due to a cat bite. We treated you with IV antibiotics and fluids. We monitored you with labs and cardiac monitoring. Please continue your antibiotic Augmentin for 3 more days.    POST-HOSPITAL & CARE INSTRUCTIONS Follow up wound care with PCP.  Please let PCP/Specialists know of any changes that were made.  Please see medications section of this packet for any medication changes.   DOCTOR'S APPOINTMENT & FOLLOW UP CARE INSTRUCTIONS  No future appointments.  RETURN PRECAUTIONS: Please return to care if you have worsening swelling, pain, fever.   Take care and be well!  Family Medicine Teaching Service  [place]  [facility]  [address] [place], [place] [address] [phone]

## 2023-04-15 NOTE — Assessment & Plan Note (Signed)
Patient received 1 dose of Zosyn in the ED. She was transitioned to Unasyn yesterday.  Patient remained afebrile and normotensive overnight. Leukocytosis improved from 16.7 to 12.8. Patient reports she is feeling better - Bcx: NG>24H, continue to f/u - transition to PO augmentin today, d/c unasyn - d/c IVF -scheduled meloxicam 15mg  with with oxy prn for pain control - continue to monitor wound - PT/OT following, appreciate recs - AM CBC

## 2023-04-15 NOTE — TOC CM/SW Note (Signed)
Transition of Care [facility]) - Inpatient Brief Assessment   Patient Details  Name: COLISHA KOLM MRN: [id number] Date of Birth: February 06, 1956  Transition of Care [facility]) CM/SW Contact:    Darrold Span, RN Phone Number: 04/15/2023, 3:31 PM   Clinical Narrative: Pt from home- independent PTA, noted no PCP listed- CM will follow up prior to discharge, also noted initial recs for outpt PT- TOC will also follow for outpt referral needs.  Currently still receiving IV abx   Transition of Care Asessment: Insurance and Status: Insurance coverage has been reviewed Patient has primary care physician: No Home environment has been reviewed: home Prior level of function:: independent Prior/Current Home Services: No current home services Social Determinants of Health Reivew: SDOH reviewed no interventions necessary Readmission risk has been reviewed: Yes Transition of care needs: transition of care needs identified, TOC will continue to follow

## 2023-04-15 NOTE — Progress Notes (Addendum)
     Daily Progress Note Intern Pager: [phone]  Patient name: Brenda Austin Medical record number: [id number] Date of birth: 24-Mar-1956 Age: 67 y.o. Gender: female  Primary Care Provider: Patient, No Pcp Per Consultants: None Code Status: Full  Pt Overview and Major Events to Date:  8/12: admitted   Assessment and Plan: Patient is a 67 year old female with no pertinent past medical history admitted for sepsis likely secondary to soft tissue infection. -      Hospital     * (Principal) Sepsis [facility])     Patient received 1 dose of Zosyn in the ED. She was transitioned to  Unasyn yesterday.  Patient remained afebrile and normotensive overnight.  Leukocytosis improved from 16.7 to 12.8. Patient reports she is feeling  better - Bcx: NG>24H, continue to f/u - transition to PO augmentin today, d/c unasyn - d/c IVF -scheduled meloxicam 15mg  with with oxy prn for pain control - continue to monitor wound - PT/OT following, appreciate recs - AM CBC        Nausea, Vomitting, Diarrhea     COVID was negative. Patient reports 3 bouts of loose brown stool  overnight. Denies nausea or vomiting. - Zofran as needed - encouraged PO intake         Animal bite   Chronic and Stable Problems:   FEN/GI: Full PPx: Lovenox Dispo: Home likely today.  Barriers include clinical improvement.  Subjective:  Patient reports she is improved from yesterday.  She reports the swelling and the redness in her right leg seems to be better.  Although she does note that she still has some groin pain when she moves her right leg.  She states that she did have 3 bouts of diarrhea when going to the bathroom yesterday she states that the first 1 started off solid but after that they were all liquid brown stool.  She reports that she is in pain as she has just finished a PT session.  No other complaints at this time Objective: Temp:  [98.1 F (36.7 C)-98.6 F (37 C)] 98.2 F (36.8 C) (08/14 1120) Pulse Rate:   [73-94] 80 (08/14 1120) Resp:  [13-20] 20 (08/14 1120) BP: (107-118)/(55-72) 107/72 (08/14 1120) SpO2:  [93 %-97 %] 96 % (08/14 1120)  Physical Exam: General: Well-appearing female, sitting in chair, NAD Cardiovascular: Regular rate and rhythm, no M/R/G Respiratory: Breathing comfortably on room air.  Normal WOB Abdomen: Soft nontender nondistended, normal bowel sounds Extremities: Swollen right foot to knee, decreased erythema from yesterday  Laboratory: Most recent CBC Lab Results  Component Value Date   WBC 12.8 (H) 04/15/2023   HGB 10.0 (L) 04/15/2023   HCT 30.5 (L) 04/15/2023   MCV 93.0 04/15/2023   PLT 194 04/15/2023   Most recent BMP    Latest Ref Rng & Units 04/15/2023    6:48 AM  BMP  Glucose 70 - 99 mg/dL 97   BUN 8 - 23 mg/dL 18   Creatinine 2.84 - 1.00 mg/dL 1.32   Sodium 440 - 102 mmol/L 132   Potassium 3.5 - 5.1 mmol/L 3.9   Chloride 98 - 111 mmol/L 103   CO2 22 - 32 mmol/L 21   Calcium 8.9 - 10.3 mg/dL 8.3    Imaging/Diagnostic Tests: No new imaging  Penne Lash, MD 04/15/2023, 11:39 AM  PGY-1, [place] [facility] FPTS Intern pager: [phone], text pages welcome Secure chat group [facility] Teaching Service

## 2023-04-15 NOTE — Discharge Summary (Incomplete)
Family Medicine Teaching [facility] Discharge Summary  Patient name: Brenda Austin Medical record number: [id number] Date of birth: April 15, 1956 Age: 67 y.o. Gender: female Date of Admission: 04/13/2023  Date of Discharge: 04/16/23  Admitting Physician: Lorayne Bender, MD  Primary Care Provider: Deatra James, MD Consultants: None  Indication for Hospitalization: Sepsis secondary to animal bite  Discharge Diagnoses/Problem List:  Principal Problem for Admission:  Principal Problem:   Sepsis [facility]) Active Problems:   Nausea, Vomitting, Diarrhea   Animal bite  Brief Hospital Course:  Brenda Austin is a 67 y.o.female who was admitted to the [facility] Medicine Teaching Service at [facility] for sepsis due to cellulitis after a cat bite. Her hospital course is detailed below:  Sepsis 2/2 to lower extremity cellulitis  Patient presented to the ED with nausea, vomiting and pain in her right leg after cat bite. Bite occurred on the right shin on 8/11. Reported swelling, pain, erythema, subjective fevers, chills and green, non bloody emesis. While in the ED, she was hypotensive to 80/61 initially and was found to have a lactic acid of 2.9 and leukocytosis to 16.7. Her blood pressure subsequently improved. Physical exam was notable for erythema and 3 puncture wounds on the RLE, one anteriorly and two posteriorly. X-ray of her RLE showed subcutaneous soft tissue edema and no acute fracture or foreign body.  Blood cultures showed no growth >2 days. Patient was started on Unasyn for coverage of Pasteurella and transition to 5 day course of augmentin prior to discharge with meloxicam for 2 weeks. Patient received tetanus shot during admission.  Acute Kidney Injury  Creatinine elevated at 1.24, above baseline of 0.8. Suspected pre-renal in setting of fluid loss from diarrhea, vomiting and poor PO intake. She was given 2L boluses of fluids for resuscitation and started on fluids. AKI was resolved on  discharge.  Other chronic conditions were medically managed with home medications and formulary alternatives as necessary (none)  PCP Follow-up Recommendations: Follow-up on bite on right shin Ensure patient completed full dose of antibiotics  Disposition: Home with family  Discharge Condition: Stable  Discharge Exam:  Vitals:   04/16/23 0804 04/16/23 1110  BP: 131/80 (!) 148/89  Pulse: 74 76  Resp: 15 17  Temp: 98 F (36.7 C) 97.8 F (36.6 C)  SpO2: 97% 98%   Physical Exam: General: Well-appearing female, sitting in chair, NAD Cardiovascular: Regular rate and rhythm, no M/R/G Respiratory: Breathing comfortably on room air.  Normal WOB Abdomen: Soft nontender nondistended, normal bowel sounds Extremities: Swollen right foot to knee, decreased erythema from yesterday  Significant Procedures: None  Significant Labs and Imaging:  Recent Labs  Lab 04/15/23 0648 04/16/23 0339  WBC 12.8* 9.5  HGB 10.0* 9.8*  HCT 30.5* 30.2*  PLT 194 208   Recent Labs  Lab 04/15/23 0648 04/16/23 0339  NA 132* 133*  K 3.9 3.7  CL 103 101  CO2 21* 22  GLUCOSE 97 101*  BUN 18 14  CREATININE 1.05* 0.97  CALCIUM 8.3* 8.5*  PHOS  --  2.3*  ALKPHOS 57  --   AST 18  --   ALT 17  --   ALBUMIN 2.2* 2.3*   Pertinent Imaging:  R Tibia/Fibula: IMPRESSION: 1. No acute displaced fracture or dislocation. 2. Subcutaneus soft tissue edema of the anterior mid leg. No retained radiopaque foreign body.  Results/Tests Pending at Time of Discharge:   Discharge Medications:  Allergies as of 04/16/2023       Reactions  Clindamycin/lincomycin Nausea And Vomiting   Codeine Sulfate    Tape Rash        Medication List     TAKE these medications    acetaminophen 500 MG tablet Commonly known as: TYLENOL Take 500 mg by mouth every 6 (six) hours as needed for moderate pain.   amoxicillin-clavulanate 875-125 MG tablet Commonly known as: AUGMENTIN Take 1 tablet by mouth every 12  (twelve) hours for 3 days.   meloxicam 15 MG tablet Commonly known as: MOBIC Take 1 tablet (15 mg total) by mouth daily.   neomycin-polymyxin b-dexamethasone 3.5-10000-0.1 Susp Commonly known as: MAXITROL Place 1 drop into both eyes 4 (four) times daily.   neomycin-polymyxin b-dexamethasone 3.5-10000-0.1 Oint Commonly known as: MAXITROL Place 1 Application into both eyes in the morning and at bedtime.   oxyCODONE 5 MG immediate release tablet Commonly known as: Oxy IR/ROXICODONE Take 1 tablet (5 mg total) by mouth every 8 (eight) hours as needed for severe pain or breakthrough pain.               Durable Medical Equipment  (From admission, onward)           Start     Ordered   04/16/23 1120  DME Walker  Once       Question Answer Comment  Walker: Other   Comments Rolling walker   Patient needs a walker to treat with the following condition Gait difficulty      04/16/23 1122   04/16/23 0000  DME Bedside commode       Question:  Patient needs a bedside commode to treat with the following condition  Answer:  Gait difficulty   04/16/23 1122            Discharge Instructions: Please refer to Patient Instructions section of EMR for full details.  Patient was counseled important signs and symptoms that should prompt return to medical care, changes in medications, dietary instructions, activity restrictions, and follow up appointments.   Follow-Up Appointments:  Follow-up Information     [facility] Follow up.   Specialty: Family Medicine Why: New paitent and follow up hospitalization Contact information: [address] [phone]        [facility] Follow up.   Specialty: Rehabilitation Why: Referral made, will callyou to schedule. Contact information: [address] [place] [address] [phone]         [facility] Follow up.   Why: Your medical equiptment provider                Penne Lash, MD 04/16/2023, 12:13 PM PGY-1, [facility] Family Medicine

## 2023-04-15 NOTE — TOC CAGE-AID Note (Signed)
Transition of Care [facility]) - CAGE-AID Screening   Patient Details  Name: Brenda Austin MRN: [id number] Date of Birth: February 11, 1956  Hewitt Shorts, RN Trauma Response Nurse Phone Number: [phone] 04/15/2023, 9:57 AM     CAGE-AID Screening:    Have You Ever Felt You Ought to Cut Down on Your Drinking or Drug Use?: No Have People Annoyed You By Critizing Your Drinking Or Drug Use?: No Have You Felt Bad Or Guilty About Your Drinking Or Drug Use?: No Have You Ever Had a Drink or Used Drugs First Thing In The Morning to Steady Your Nerves or to Get Rid of a Hangover?: No CAGE-AID Score: 0  Substance Abuse Education Offered: No (Drinks socially, no services needed)

## 2023-04-15 NOTE — Progress Notes (Signed)
Patient started desating into low 80s. RN placed 2L [place] on pt while sleeping.

## 2023-04-15 NOTE — Progress Notes (Signed)
Physical Therapy Treatment Patient Details Name: Brenda Austin MRN: [id number] DOB: Apr 04, 1956 Today's Date: 04/15/2023   History of Present Illness Pt is a 67 y.o. F who presents 04/13/2023 with nausea, vomiting, and pain to RLE after a cat bite. Admitted with sepsis. Significant PMH: none.    PT Comments  Pt making steady progress towards her physical therapy goals and is motivated to participate. Improvement noted in distal RLE swelling and erythema and pt reports able to complete ankle ROM exercises overnight. Pt ambulating 50 ft x 2 with a walker and negotiated 2 steps with a right railing sideways to simulate home set up. Improving ability to weightbear through RLE. Recommend OPPT at d/c.    If plan is discharge home, recommend the following: Assistance with cooking/housework;Assist for transportation;Help with stairs or ramp for entrance   Can travel by private vehicle        Equipment Recommendations  Rolling walker (2 wheels)    Recommendations for Other Services       Precautions / Restrictions Precautions Precautions: Fall Precaution Comments: low fall risk Restrictions Weight Bearing Restrictions: No     Mobility  Bed Mobility Overal bed mobility: Modified Independent                  Transfers Overall transfer level: Needs assistance Equipment used: Rolling walker (2 wheels) Transfers: Sit to/from Stand Sit to Stand: Supervision           General transfer comment: Cues for hand placement, decreased eccentric control for lowering    Ambulation/Gait Ambulation/Gait assistance: Supervision Gait Distance (Feet): 100 Feet (50", 50") Assistive device: Rolling walker (2 wheels) Gait Pattern/deviations: Step-to pattern, Decreased stance time - right, Decreased step length - right, Decreased dorsiflexion - right Gait velocity: decreased     General Gait Details: Progressive weightbearing through RLE, able to achieve foot flat fairly consistently. Heavy  use of BUE's on walker   Stairs Stairs: Yes Stairs assistance: Contact guard assist Stair Management: One rail Right, Sideways Number of Stairs: 2 General stair comments: cues for sequencing/technique   Wheelchair Mobility     Tilt Bed    Modified Rankin (Stroke Patients Only)       Balance Overall balance assessment: Needs assistance Sitting-balance support: Feet supported Sitting balance-Leahy Scale: Good     Standing balance support: Bilateral upper extremity supported Standing balance-Leahy Scale: Poor Standing balance comment: reliant on BUE support                            Cognition Arousal: Alert Behavior During Therapy: WFL for tasks assessed/performed Overall Cognitive Status: Within Functional Limits for tasks assessed                                          Exercises      General Comments        Pertinent Vitals/Pain Pain Assessment Pain Assessment: Faces Faces Pain Scale: Hurts even more Pain Location: RLE Pain Descriptors / Indicators: Tightness, Grimacing, Guarding Pain Intervention(s): Limited activity within patient's tolerance, Monitored during session, Premedicated before session    Home Living                          Prior Function            PT Goals (current goals  can now be found in the care plan section) Acute Rehab PT Goals Patient Stated Goal: to walk better PT Goal Formulation: With patient Time For Goal Achievement: 04/28/23 Potential to Achieve Goals: Good Progress towards PT goals: Progressing toward goals    Frequency    Min 1X/week      PT Plan      Co-evaluation              AM-PAC PT "6 Clicks" Mobility   Outcome Measure  Help needed turning from your back to your side while in a flat bed without using bedrails?: None Help needed moving from lying on your back to sitting on the side of a flat bed without using bedrails?: None Help needed moving to and  from a bed to a chair (including a wheelchair)?: A Little Help needed standing up from a chair using your arms (e.g., wheelchair or bedside chair)?: A Little Help needed to walk in hospital room?: A Little Help needed climbing 3-5 steps with a railing? : A Little 6 Click Score: 20    End of Session   Activity Tolerance: Patient tolerated treatment well Patient left: with call bell/phone within reach;in chair Nurse Communication: Mobility status PT Visit Diagnosis: Other abnormalities of gait and mobility (R26.89);Difficulty in walking, not elsewhere classified (R26.2);Pain Pain - Right/Left: Right Pain - part of body: Leg     Time: 1610-9604 PT Time Calculation (min) (ACUTE ONLY): 31 min  Charges:    $Gait Training: 8-22 mins $Therapeutic Activity: 8-22 mins PT General Charges $$ ACUTE PT VISIT: 1 Visit                     Lillia Pauls, PT, DPT Acute Rehabilitation Services Office [phone]    Norval Morton 04/15/2023, 9:03 AM

## 2023-04-15 NOTE — Assessment & Plan Note (Addendum)
COVID was negative. Patient reports 3 bouts of loose brown stool overnight. Denies nausea or vomiting. - Zofran as needed - encouraged PO intake

## 2023-04-15 NOTE — Evaluation (Signed)
Occupational Therapy Evaluation Patient Details Name: Brenda Austin MRN: [id number] DOB: Jul 11, 1956 Today's Date: 04/15/2023   History of Present Illness Pt is a 67 y.o. F who presents 04/13/2023 with nausea, vomiting, and pain to RLE after a cat bite. Admitted with sepsis. Significant PMH: none.   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 PT mobilizing well with RW and overall set up for ADL tasks. Will follow up prior to DC to review shower transfer techniques and home safety to reduce risk of falls. Pt plans to DC home to stay with her son initially.    Pt very appreciative.     If plan is discharge home, recommend the following: Assist for transportation;Assistance with cooking/housework    Functional Status Assessment  Patient has had a recent decline in their functional status and demonstrates the ability to make significant improvements in function in a reasonable and predictable amount of time.  Equipment Recommendations  BSC/3in1    Recommendations for Other Services       Precautions / Restrictions Precautions Precautions: Fall      Mobility Bed Mobility Overal bed mobility: Modified Independent                  Transfers Overall transfer level: Needs assistance Equipment used: Rolling walker (2 wheels)   Sit to Stand: Supervision                  Balance     Sitting balance-Leahy Scale: Good       Standing balance-Leahy Scale: Poor                             ADL either performed or assessed with clinical judgement   ADL Overall ADL's : Needs assistance/impaired                                     Functional mobility during ADLs: Supervision/safety;Rolling walker (2 wheels);Cueing for safety General ADL Comments: overall set up for ADL; began educating on compensatory strateiges and home safety     Vision         Perception         Praxis          Pertinent Vitals/Pain Pain Assessment Pain Assessment: Faces Pain Score: 8  Pain Location: RLE Pain Descriptors / Indicators: Tightness, Grimacing, Guarding Pain Intervention(s): Limited activity within patient's tolerance     Extremity/Trunk Assessment Upper Extremity Assessment Upper Extremity Assessment: Overall WFL for tasks assessed   Lower Extremity Assessment Lower Extremity Assessment: Defer to PT evaluation   Cervical / Trunk Assessment Cervical / Trunk Assessment: Normal   Communication Communication Communication: No apparent difficulties   Cognition Arousal: Alert Behavior During Therapy: WFL for tasks assessed/performed Overall Cognitive Status: Within Functional Limits for tasks assessed                                       General Comments       Exercises Exercises: Other exercises Other Exercises Other Exercises: heel cord stretches x 10; hold for 20 seconds   Shoulder Instructions      Home Living Family/patient expects to be discharged to:: Private residence Living Arrangements: Alone Available Help at Discharge: Family;Available  24 hours/day Type of Home: House Home Access: Stairs to enter [employer] of Steps: 2 Entrance Stairs-Rails: Left Home Layout: Multi-level (4 floors but able to live on 1 level)     Bathroom Shower/Tub: Producer, television/film/video: Handicapped height Bathroom Accessibility: Yes How Accessible: Accessible via walker Home Equipment: Shower seat - built in;Other (comment) (pt unsure if there is a built in)          Prior Functioning/Environment Prior Level of Function : Independent/Modified Independent             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, broadcasting/film/video; dog/catq        OT Problem List: Decreased strength;Decreased range of motion;Decreased activity tolerance;Impaired balance (sitting and/or  standing);Decreased knowledge of use of DME or AE;Pain      OT Treatment/Interventions: Self-care/ADL training;Therapeutic exercise;DME and/or AE instruction;Therapeutic activities;Patient/family education    OT Goals(Current goals can be found in the care plan section) Acute Rehab OT Goals Patient Stated Goal: to get her strength back OT Goal Formulation: With patient Time For Goal Achievement: 04/29/23 Potential to Achieve Goals: Good  OT Frequency: Min 1X/week    Co-evaluation              AM-PAC OT "6 Clicks" Daily Activity     Outcome Measure Help from another person eating meals?: None Help from another person taking care of personal grooming?: A Little Help from another person toileting, which includes using toliet, bedpan, or urinal?: A Little Help from another person bathing (including washing, rinsing, drying)?: A Little Help from another person to put on and taking off regular upper body clothing?: A Little Help from another person to put on and taking off regular lower body clothing?: A Little 6 Click Score: 19   End of Session Equipment Utilized During Treatment: Gait belt;Rolling walker (2 wheels) Nurse Communication: Mobility status  Activity Tolerance: Patient tolerated treatment well Patient left: in chair;with call bell/phone within reach;with family/visitor present  OT Visit Diagnosis: Pain;Muscle weakness (generalized) (M62.81) Pain - Right/Left: Right Pain - part of body: Leg                Time: 9629-5284 OT Time Calculation (min): 31 min Charges:  OT General Charges $OT Visit: 1 Visit OT Evaluation $OT Eval Moderate Complexity: 1 Mod OT Treatments $Self Care/Home Management : 8-22 mins  Luisa Dago, OT/L   Acute OT Clinical Specialist Acute Rehabilitation Services Pager [phone] Office [phone]   [facility] 04/15/2023, 4:04 PM

## 2023-04-16 ENCOUNTER — Other Ambulatory Visit (HOSPITAL_COMMUNITY): Payer: Self-pay

## 2023-04-16 LAB — RENAL FUNCTION PANEL
Albumin: 2.3 g/dL — ABNORMAL LOW (ref 3.5–5.0)
Anion gap: 10 (ref 5–15)
BUN: 14 mg/dL (ref 8–23)
CO2: 22 mmol/L (ref 22–32)
Calcium: 8.5 mg/dL — ABNORMAL LOW (ref 8.9–10.3)
Chloride: 101 mmol/L (ref 98–111)
Creatinine, Ser: 0.97 mg/dL (ref 0.44–1.00)
GFR, Estimated: >60 mL/min (ref >60)
Glucose, Bld: 101 mg/dL — ABNORMAL HIGH (ref 70–99)
Phosphorus: 2.3 mg/dL — ABNORMAL LOW (ref 2.5–4.6)
Potassium: 3.7 mmol/L (ref 3.5–5.1)
Sodium: 133 mmol/L — ABNORMAL LOW (ref 135–145)

## 2023-04-16 LAB — CBC
HCT: 30.2 % — ABNORMAL LOW (ref 36.0–46.0)
Hemoglobin: 9.8 g/dL — ABNORMAL LOW (ref 12.0–15.0)
MCH: 29.3 pg (ref 26.0–34.0)
MCHC: 32.5 g/dL (ref 30.0–36.0)
MCV: 90.4 fL (ref 80.0–100.0)
Platelets: 208 10*3/uL (ref 150–400)
RBC: 3.34 MIL/uL — ABNORMAL LOW (ref 3.87–5.11)
RDW: 13.6 % (ref 11.5–15.5)
WBC: 9.5 10*3/uL (ref 4.0–10.5)
nRBC: 0 % (ref 0.0–0.2)

## 2023-04-16 MED ORDER — TETANUS-DIPHTHERIA TOXOIDS TD 5-2 LFU IM INJ
0.5000 mL | INJECTION | Freq: Once | INTRAMUSCULAR | Status: AC
  Administered 2023-04-16: 0.5 mL via INTRAMUSCULAR
  Filled 2023-04-16: qty 0.5

## 2023-04-16 MED ORDER — MELOXICAM 15 MG PO TABS
15.0000 mg | ORAL_TABLET | Freq: Every day | ORAL | 0 refills | Status: DC
  Filled 2023-04-16: qty 14, 14d supply, fill #0

## 2023-04-16 MED ORDER — AMOXICILLIN-POT CLAVULANATE 875-125 MG PO TABS
1.0000 | ORAL_TABLET | Freq: Two times a day (BID) | ORAL | 0 refills | Status: AC
  Filled 2023-04-16: qty 6, 3d supply, fill #0

## 2023-04-16 MED ORDER — OXYCODONE HCL 5 MG PO TABS
5.0000 mg | ORAL_TABLET | Freq: Three times a day (TID) | ORAL | 0 refills | Status: DC | PRN
  Filled 2023-04-16: qty 3, 1d supply, fill #0

## 2023-04-16 NOTE — TOC Transition Note (Signed)
Transition of Care [facility]) - CM/SW Discharge Note   Patient Details  Name: Brenda Austin MRN: [id number] Date of Birth: 1956/03/27  [facility] CM/SW Contact:  Lockie Pares, RN Phone Number: 04/16/2023, 12:09 PM   Clinical Narrative:     Received consult regarding DME and OP PT. Called son to discuss transportation home for DC 1100 DIL ( wife of son) called back regarding DC needs. Discussed OP PT referral. She feels like she will not be with them long. She was independent prior.She expressed that it more that the patient is nervous rather than have a lot of physical needs. We discussed that OP referral needs to be in [place] versus [place], and this will assist in getting her back home to start therapy. Ordered Walker through [employer], they already have a BSC. Referral for OP OT placed for [facility] [address]. They can pick her up when DC. Messaged with nursing and MD  Will DC today  Final next level of care: Home/Self Care (OP PT) Barriers to Discharge: No Barriers Identified   Patient Goals and CMS Choice    To sons house for brief stay and back home, OP PT referral.  Discharge Placement                         Discharge Plan and Services Additional resources added to the After Visit Summary for                  DME Arranged: Dan Humphreys DME Agency: [employer] Date DME Agency Contacted: 04/16/23 Time DME Agency Contacted: 1209 Representative spoke with at DME Agency: Vaughan Basta            Social Determinants of Health (SDOH) Interventions SDOH Screenings   Food Insecurity: No Food Insecurity (04/14/2023)  Housing: Low Risk  (04/14/2023)  Transportation Needs: No Transportation Needs (04/14/2023)  Utilities: Not At Risk (04/14/2023)  Tobacco Use: Medium Risk (04/13/2023)     Readmission Risk Interventions     No data to display

## 2023-04-16 NOTE — Plan of Care (Signed)
  Problem: Health Behavior/Discharge Planning: Goal: Ability to manage health-related needs will improve Outcome: Progressing   Problem: Clinical Measurements: Goal: Ability to maintain clinical measurements within normal limits will improve Outcome: Progressing Goal: Will remain free from infection Outcome: Progressing Goal: Diagnostic test results will improve Outcome: Progressing Goal: Respiratory complications will improve Outcome: Progressing Goal: Cardiovascular complication will be avoided Outcome: Progressing   Problem: Activity: Goal: Risk for activity intolerance will decrease Outcome: Progressing   Problem: Nutrition: Goal: Adequate nutrition will be maintained Outcome: Progressing   Problem: Coping: Goal: Level of anxiety will decrease Outcome: Progressing   Problem: Safety: Goal: Ability to remain free from injury will improve Outcome: Progressing   Problem: Skin Integrity: Goal: Risk for impaired skin integrity will decrease Outcome: Progressing   Problem: Fluid Volume: Goal: Hemodynamic stability will improve Outcome: Progressing   Problem: Clinical Measurements: Goal: Diagnostic test results will improve Outcome: Progressing Goal: Signs and symptoms of infection will decrease Outcome: Progressing   Problem: Respiratory: Goal: Ability to maintain adequate ventilation will improve Outcome: Progressing   Problem: Clinical Measurements: Goal: Ability to avoid or minimize complications of infection will improve Outcome: Progressing   Problem: Skin Integrity: Goal: Skin integrity will improve Outcome: Progressing

## 2023-04-16 NOTE — Progress Notes (Signed)
Delivered patient's TOC to the unit.  Patient's RN was available. Unit secretary agreed to keep at the nurses station.

## 2023-04-16 NOTE — Care Management Important Message (Signed)
Important Message  Patient Details  Name: Brenda Austin MRN: [id number] Date of Birth: February 25, 1956   Medicare Important Message Given:  Yes     Sherilyn Banker 04/16/2023, 12:54 PM

## 2023-04-16 NOTE — Progress Notes (Signed)
Occupational Therapy Treatment Patient Details Name: Brenda Austin MRN: [id number] DOB: [birth year] Today's Date: 04/16/2023   History of present illness Pt is a 67 y.o. F who presents 04/13/2023 with nausea, vomiting, and pain to RLE after a cat bite. Admitted with sepsis. Significant PMH: none.   OT comments  Completed education regarding compensatory strategies for ADL and functional mobility as well as movement patterns to help with RLE mobility and reducing pain. Pt very appreciative and verbalized understanding. OT signing off.       If plan is discharge home, recommend the following:  Assist for transportation;Assistance with cooking/housework   Equipment Recommendations  BSC/3in1    Recommendations for Other Services      Precautions / Restrictions         Mobility Bed Mobility Overal bed mobility: Modified Independent                  Transfers Overall transfer level: Modified independent                       Balance                                           ADL either performed or assessed with clinical judgement   ADL Overall ADL's : Modified independent                                       General ADL Comments: educated on compensatory strategies for ADL including strategies to reduce risk of falls. Pt withincontinence of BM at times; Educated on use of briefs to reduce hurrying to commode and using BSC by her to again reduce risk of falls.Pt verblaized understanding. Issued barrier cream to help protect skin from incontinence    Extremity/Trunk Assessment     Lower Extremity Assessment Lower Extremity Assessment: RLE deficits/detail ("tightness and swelling")        Vision       Perception     Praxis      Cognition Arousal: Alert Behavior During Therapy: WFL for tasks assessed/performed Overall Cognitive Status: Within Functional Limits for tasks assessed                                           Exercises Other Exercises Other Exercises: heel cord stretches Other Exercises: Big Toe pose Other Exercises: windshied wipers    Shoulder Instructions       General Comments Pt ambulating with improved step through pattern with cues ot relax shoulders. Pt states she feel tight/stuck. Incorporated yoga movement patterns that she is familiar with into exercises. Pt reports this helped significantly. Also educated on mindfulness strategies to help during with feelings of anxiety she id having since her hospitalization    Pertinent Vitals/ Pain       Pain Assessment Pain Assessment: Faces Faces Pain Scale: Hurts even more Pain Location: RLE Pain Descriptors / Indicators: Tightness, Grimacing, Guarding Pain Intervention(s): Limited activity within patient's tolerance  Home Living  Prior Functioning/Environment              Frequency  Min 1X/week        Progress Toward Goals  OT Goals(current goals can now be found in the care plan section)  Progress towards OT goals: Goals met/education completed, patient discharged from OT  Acute Rehab OT Goals Patient Stated Goal: to return to normal activity OT Goal Formulation: With patient Time For Goal Achievement: 04/29/23 Potential to Achieve Goals: Good ADL Goals Pt Will Perform Tub/Shower Transfer: with modified independence Additional ADL Goal #1: Independently verbalize 3 stratgies to reduce risk of falls  Plan      Co-evaluation                 AM-PAC OT "6 Clicks" Daily Activity     Outcome Measure   Help from another person eating meals?: None Help from another person taking care of personal grooming?: None Help from another person toileting, which includes using toliet, bedpan, or urinal?: None Help from another person bathing (including washing, rinsing, drying)?: None Help from another person to put on and taking off  regular upper body clothing?: None Help from another person to put on and taking off regular lower body clothing?: None 6 Click Score: 24    End of Session Equipment Utilized During Treatment: Gait belt;Rolling walker (2 wheels)  OT Visit Diagnosis: Pain;Muscle weakness (generalized) (M62.81) Pain - Right/Left: Right Pain - part of body: Leg   Activity Tolerance Patient tolerated treatment well   Patient Left in bed;with call bell/phone within reach   Nurse Communication Mobility status        Time: 7829-5621 OT Time Calculation (min): 37 min  Charges: OT General Charges $OT Visit: 1 Visit OT Treatments $Self Care/Home Management : 23-37 mins  Luisa Dago, OT/L   Acute OT Clinical Specialist Acute Rehabilitation Services Pager [phone] Office [phone]   [facility] 04/16/2023, 3:23 PM

## 2023-04-16 NOTE — Progress Notes (Signed)
Patient voiced concerned with this RN that she doesn't feel like she is ready to discharge today. Patient stated that due to her still having loose bowel movements and incontinence she may not be ready to leave. Patient also has concerns that she will become dehydrated if this continues. Patient wants to voice concerns to rounding doctors before any possible discharge today.

## 2023-04-20 ENCOUNTER — Ambulatory Visit: Payer: Self-pay

## 2023-04-20 NOTE — Telephone Encounter (Signed)
     Chief Complaint: Cat bite and wound to right lower leg. Seen in ED 04/13/23. Wound "is still draining yellow pus and I took my last antibiotic." Currently out of town. Symptoms: Above Frequency: Last week  Pertinent Negatives: Patient denies fever Disposition: [] ED /[x] Urgent Care (no appt availability in office) / [] Appointment(In office/virtual)/ []  [facility]/ [] Home Care/ [] Refused Recommended Disposition /[] [facility]/ []  Follow-up with PCP Additional Notes: Pt. Agrees with UC.  Reason for Disposition  [1] Looks infected (spreading redness, pus) AND [2] no fever  Answer Assessment - Initial Assessment Questions 1. APPEARANCE of INJURY: "What does the injury look like?"      Puncture 2. SIZE: "How large is the cut?"      Eraser 3. BLEEDING: "Is it bleeding now?" If Yes, ask: "Is it difficult to stop?"      No 4. LOCATION: "Where is the injury located?"      Right leg near ankle 5. ONSET: "How long ago did the injury occur?"      Last Sunday 6. MECHANISM: "Tell me how it happened."      Cat bite 7. TETANUS: "When was the last tetanus booster?"     Unsure 8. PREGNANCY: "Is there any chance you are pregnant?" "When was your last menstrual period?"     No  Protocols used: Skin Injury-A-AH

## 2023-04-27 DIAGNOSIS — Z136 Encounter for screening for cardiovascular disorders: Secondary | ICD-10-CM | POA: Diagnosis not present

## 2023-04-27 DIAGNOSIS — L03115 Cellulitis of right lower limb: Secondary | ICD-10-CM | POA: Diagnosis not present

## 2023-04-27 DIAGNOSIS — Z13220 Encounter for screening for lipoid disorders: Secondary | ICD-10-CM | POA: Diagnosis not present

## 2023-04-27 DIAGNOSIS — Z0000 Encounter for general adult medical examination without abnormal findings: Secondary | ICD-10-CM | POA: Diagnosis not present

## 2023-04-27 DIAGNOSIS — Z131 Encounter for screening for diabetes mellitus: Secondary | ICD-10-CM | POA: Diagnosis not present

## 2023-05-05 DIAGNOSIS — L03115 Cellulitis of right lower limb: Secondary | ICD-10-CM | POA: Diagnosis not present

## 2023-05-06 ENCOUNTER — Inpatient Hospital Stay (INDEPENDENT_AMBULATORY_CARE_PROVIDER_SITE_OTHER): Payer: Medicare Other | Admitting: Primary Care

## 2023-05-06 ENCOUNTER — Encounter (INDEPENDENT_AMBULATORY_CARE_PROVIDER_SITE_OTHER): Payer: Self-pay

## 2023-05-12 ENCOUNTER — Other Ambulatory Visit: Payer: Self-pay | Admitting: Obstetrics and Gynecology

## 2023-05-12 DIAGNOSIS — Z1211 Encounter for screening for malignant neoplasm of colon: Secondary | ICD-10-CM | POA: Diagnosis not present

## 2023-05-12 DIAGNOSIS — L03115 Cellulitis of right lower limb: Secondary | ICD-10-CM | POA: Diagnosis not present

## 2023-05-12 DIAGNOSIS — E2839 Other primary ovarian failure: Secondary | ICD-10-CM

## 2023-05-19 DIAGNOSIS — L03115 Cellulitis of right lower limb: Secondary | ICD-10-CM | POA: Diagnosis not present

## 2023-05-26 ENCOUNTER — Encounter: Payer: Self-pay | Admitting: Internal Medicine

## 2023-05-26 ENCOUNTER — Other Ambulatory Visit: Payer: Self-pay

## 2023-05-26 ENCOUNTER — Ambulatory Visit: Payer: Medicare Other | Admitting: Internal Medicine

## 2023-05-26 VITALS — BP 124/81 | HR 76 | Resp 16 | Ht 65.0 in | Wt 146.0 lb

## 2023-05-26 DIAGNOSIS — L539 Erythematous condition, unspecified: Secondary | ICD-10-CM

## 2023-05-26 DIAGNOSIS — S80871D Other superficial bite, right lower leg, subsequent encounter: Secondary | ICD-10-CM | POA: Diagnosis not present

## 2023-05-26 DIAGNOSIS — M7989 Other specified soft tissue disorders: Secondary | ICD-10-CM

## 2023-05-26 DIAGNOSIS — W5501XD Bitten by cat, subsequent encounter: Secondary | ICD-10-CM | POA: Diagnosis not present

## 2023-05-26 DIAGNOSIS — T148XXA Other injury of unspecified body region, initial encounter: Secondary | ICD-10-CM

## 2023-05-26 NOTE — Progress Notes (Signed)
[facility]      Reason for Consult: cellulitis    Referring Physician: Dr. Wynelle Link    Patient ID: Brenda Austin, female    DOB: 05/15/1956, 67 y.o.   MRN: [id number]  HPI:   Brenda Austin is here for evaluation of recent cellulitis.  She had a cat bite of her right shin area and developed fever associated with redness, a wound, swelling and warmth of the leg and was hospitalized.  She was treated with IV antibiotics.  WBC initially 16.7.  Sent out on Augmentin.  Not felt to be any better and given doxycycline.  Slowly healed.  Wound not open now.  Some erythema persists.  No pain. Able to continue walking and keeping active.    Past Medical History:  Diagnosis Date   Basal cell carcinoma    Horseshoe kidney    Kidney stone     Prior to Admission medications   Not on File    Allergies  Allergen Reactions   Clindamycin/Lincomycin Nausea And Vomiting   Codeine Sulfate    Tape Rash    Social History   Tobacco Use   Smoking status: Former    Current packs/day: 0.00    Types: Cigarettes    Quit date: 04/13/2010    Years since quitting: 13.1   Smokeless tobacco: Never  Substance Use Topics   Alcohol use: Yes    Comment: couple drinks/month    Family History  Problem Relation Age of Onset   Hypertension Mother    CVA Mother    Prostate cancer Father    Testicular cancer Brother     Review of Systems  Constitutional: negative for fevers and chills All other systems reviewed and are negative    Constitutional: in no apparent distress There were no vitals filed for this visit. EYES: anicteric Respiratory: normal respiratory effort Musculoskeletal: right leg with closed wound area; some surrounding erythema that blanches, no warmth, some edema in the area and foot  Labs: Lab Results  Component Value Date   WBC 9.5 04/16/2023   HGB 9.8 (L) 04/16/2023   HCT 30.2 (L) 04/16/2023   MCV 90.4 04/16/2023   PLT 208 04/16/2023    Lab Results   Component Value Date   CREATININE 0.97 04/16/2023   BUN 14 04/16/2023   NA 133 (L) 04/16/2023   K 3.7 04/16/2023   CL 101 04/16/2023   CO2 22 04/16/2023    Lab Results  Component Value Date   ALT 17 04/15/2023   AST 18 04/15/2023   ALKPHOS 57 04/15/2023   BILITOT 1.4 (H) 04/15/2023   INR 1.1 04/13/2023     Assessment: resolved cellulitis with a closed wound.  Some residual findings of swelling and erythema but not c/w infection.  Discussed keeping leg elevated.  Compression stockings.  No further antibiotics indicated.    Plan: 1)  compression stockings 2) elevation Keep active

## 2023-05-28 DIAGNOSIS — H02831 Dermatochalasis of right upper eyelid: Secondary | ICD-10-CM | POA: Diagnosis not present

## 2023-05-28 DIAGNOSIS — H04123 Dry eye syndrome of bilateral lacrimal glands: Secondary | ICD-10-CM | POA: Diagnosis not present

## 2023-05-28 DIAGNOSIS — H25813 Combined forms of age-related cataract, bilateral: Secondary | ICD-10-CM | POA: Diagnosis not present

## 2023-05-28 DIAGNOSIS — H1045 Other chronic allergic conjunctivitis: Secondary | ICD-10-CM | POA: Diagnosis not present

## 2023-06-29 ENCOUNTER — Other Ambulatory Visit (HOSPITAL_COMMUNITY): Payer: Self-pay

## 2023-07-16 DIAGNOSIS — D0472 Carcinoma in situ of skin of left lower limb, including hip: Secondary | ICD-10-CM | POA: Diagnosis not present

## 2023-07-16 DIAGNOSIS — D485 Neoplasm of uncertain behavior of skin: Secondary | ICD-10-CM | POA: Diagnosis not present

## 2023-08-06 DIAGNOSIS — D099 Carcinoma in situ, unspecified: Secondary | ICD-10-CM | POA: Diagnosis not present

## 2023-10-09 DIAGNOSIS — L821 Other seborrheic keratosis: Secondary | ICD-10-CM | POA: Diagnosis not present

## 2023-10-09 DIAGNOSIS — C44619 Basal cell carcinoma of skin of left upper limb, including shoulder: Secondary | ICD-10-CM | POA: Diagnosis not present

## 2023-10-09 DIAGNOSIS — D239 Other benign neoplasm of skin, unspecified: Secondary | ICD-10-CM | POA: Diagnosis not present

## 2023-10-09 DIAGNOSIS — L578 Other skin changes due to chronic exposure to nonionizing radiation: Secondary | ICD-10-CM | POA: Diagnosis not present

## 2023-10-09 DIAGNOSIS — L570 Actinic keratosis: Secondary | ICD-10-CM | POA: Diagnosis not present

## 2023-10-09 DIAGNOSIS — D485 Neoplasm of uncertain behavior of skin: Secondary | ICD-10-CM | POA: Diagnosis not present

## 2023-10-09 DIAGNOSIS — L814 Other melanin hyperpigmentation: Secondary | ICD-10-CM | POA: Diagnosis not present

## 2023-10-27 DIAGNOSIS — C44619 Basal cell carcinoma of skin of left upper limb, including shoulder: Secondary | ICD-10-CM | POA: Diagnosis not present

## 2023-11-19 ENCOUNTER — Ambulatory Visit
Admission: RE | Admit: 2023-11-19 | Discharge: 2023-11-19 | Disposition: A | Discharge status: Home / Self Care | Payer: Medicare Other | Source: Ambulatory Visit | Attending: Obstetrics and Gynecology | Admitting: Obstetrics and Gynecology

## 2023-11-19 DIAGNOSIS — E2839 Other primary ovarian failure: Secondary | ICD-10-CM | POA: Diagnosis not present

## 2023-11-19 DIAGNOSIS — M8588 Other specified disorders of bone density and structure, other site: Secondary | ICD-10-CM | POA: Diagnosis not present

## 2023-11-19 DIAGNOSIS — N958 Other specified menopausal and perimenopausal disorders: Secondary | ICD-10-CM | POA: Diagnosis not present

## 2024-04-14 DIAGNOSIS — Z01419 Encounter for gynecological examination (general) (routine) without abnormal findings: Secondary | ICD-10-CM | POA: Diagnosis not present

## 2024-04-14 DIAGNOSIS — Z90710 Acquired absence of both cervix and uterus: Secondary | ICD-10-CM | POA: Insufficient documentation

## 2024-04-14 DIAGNOSIS — Z1231 Encounter for screening mammogram for malignant neoplasm of breast: Secondary | ICD-10-CM | POA: Diagnosis not present

## 2024-04-29 DIAGNOSIS — R7989 Other specified abnormal findings of blood chemistry: Secondary | ICD-10-CM | POA: Diagnosis not present

## 2024-04-29 DIAGNOSIS — Z131 Encounter for screening for diabetes mellitus: Secondary | ICD-10-CM | POA: Diagnosis not present

## 2024-04-29 DIAGNOSIS — Z1159 Encounter for screening for other viral diseases: Secondary | ICD-10-CM | POA: Diagnosis not present

## 2024-04-29 DIAGNOSIS — Z0000 Encounter for general adult medical examination without abnormal findings: Secondary | ICD-10-CM | POA: Diagnosis not present

## 2024-05-10 ENCOUNTER — Ambulatory Visit
Admission: RE | Admit: 2024-05-10 | Discharge: 2024-05-10 | Disposition: A | Discharge status: Home / Self Care | Source: Ambulatory Visit | Attending: Family Medicine | Admitting: Family Medicine

## 2024-05-10 VITALS — BP 148/85 | HR 71 | Temp 97.7°F | Resp 16

## 2024-05-10 DIAGNOSIS — L249 Irritant contact dermatitis, unspecified cause: Secondary | ICD-10-CM | POA: Diagnosis not present

## 2024-05-10 MED ORDER — TRIAMCINOLONE ACETONIDE 0.1 % EX CREA
1.0000 | TOPICAL_CREAM | Freq: Two times a day (BID) | CUTANEOUS | 0 refills | Status: AC
Start: 2024-05-10 — End: ?

## 2024-05-10 NOTE — ED Triage Notes (Signed)
 Pt states she saw a spider on her arm 1 week-round red area to left forearm 2 days after-using hydrocortisone cream and neosporin-NAD-steady gait

## 2024-05-10 NOTE — ED Provider Notes (Signed)
  [facility]  Note:  This document was prepared using Conservation officer, historic buildings and may include unintentional dictation errors.  MRN: [id number] DOB: 07-08-1956  Subjective:   Brenda Austin is a 68 y.o. female presenting for 1 week history of a persistent itchy rash over the left forearm.  Patient saw a spider crawling over the area and quickly tried to swipe it off of her arm.  Believes that in the process it did sting her/bite the area.  Has used Neosporin and cortisone cream.  No fever, drainage or pus or bleeding, tenderness.  No current facility-administered medications for this encounter. No current outpatient medications on file.   Allergies  Allergen Reactions   Clindamycin/Lincomycin Nausea And Vomiting   Codeine Sulfate    Tape Rash    Past Medical History:  Diagnosis Date   Basal cell carcinoma    Horseshoe kidney    Kidney stone      Past Surgical History:  Procedure Laterality Date   LIPOMA EXCISION     PARTIAL HYSTERECTOMY      Family History  Problem Relation Age of Onset   Hypertension Mother    CVA Mother    Prostate cancer Father    Testicular cancer Brother     Social History   Tobacco Use   Smoking status: Former    Current packs/day: 0.00    Types: Cigarettes    Quit date: 04/13/2010    Years since quitting: 14.0   Smokeless tobacco: Never  Vaping Use   Vaping status: Never Used  Substance Use Topics   Alcohol use: Yes    Comment: occ   Drug use: Not Currently    ROS   Objective:   Vitals: BP (!) 148/85 (BP Location: Right Arm)   Pulse 71   Temp 97.7 F (36.5 C) (Oral)   Resp 16   SpO2 97%   Physical Exam Constitutional:      General: She is not in acute distress.    Appearance: Normal appearance. She is well-developed. She is not ill-appearing, toxic-appearing or diaphoretic.  HENT:     Head: Normocephalic and atraumatic.     Nose: Nose normal.     Mouth/Throat:     Mouth: Mucous  membranes are moist.  Eyes:     General: No scleral icterus.       Right eye: No discharge.        Left eye: No discharge.     Extraocular Movements: Extraocular movements intact.  Cardiovascular:     Rate and Rhythm: Normal rate.  Pulmonary:     Effort: Pulmonary effort is normal.  Skin:    General: Skin is warm and dry.     Findings: Rash (macular patch ~2cm that is slightly pruritic but not tender, no drainage of pus or bleeding) present.  Neurological:     General: No focal deficit present.     Mental Status: She is alert and oriented to person, place, and time.  Psychiatric:        Mood and Affect: Mood normal.        Behavior: Behavior normal.       Assessment and Plan :   PDMP not reviewed this encounter.  1. Irritant dermatitis    Suspect irritant dermatitis and recommended triamcinolone  cream.  Counseled patient on potential for adverse effects with medications prescribed/recommended today, ER and return-to-clinic precautions discussed, patient verbalized understanding.    Christopher Savannah, [place] 05/10/24 1432

## 2024-05-26 DIAGNOSIS — H1045 Other chronic allergic conjunctivitis: Secondary | ICD-10-CM | POA: Diagnosis not present

## 2024-05-26 DIAGNOSIS — H25813 Combined forms of age-related cataract, bilateral: Secondary | ICD-10-CM | POA: Diagnosis not present

## 2024-05-26 DIAGNOSIS — H04123 Dry eye syndrome of bilateral lacrimal glands: Secondary | ICD-10-CM | POA: Diagnosis not present

## 2024-05-26 DIAGNOSIS — H35361 Drusen (degenerative) of macula, right eye: Secondary | ICD-10-CM | POA: Diagnosis not present

## 2024-05-31 ENCOUNTER — Telehealth: Admitting: Physician Assistant

## 2024-05-31 DIAGNOSIS — L03114 Cellulitis of left upper limb: Secondary | ICD-10-CM | POA: Diagnosis not present

## 2024-05-31 MED ORDER — CEPHALEXIN 500 MG PO CAPS: 500.0000 mg | ORAL_CAPSULE | Freq: Three times a day (TID) | ORAL | 0 refills | Status: AC

## 2024-05-31 NOTE — Patient Instructions (Signed)
  Brenda Austin, thank you for joining Brenda Velma Lunger, PA-C for today's virtual visit.  While this provider is not your primary care provider (PCP), if your PCP is located in our provider database this encounter information will be shared with them immediately following your visit.   A [facility] MyChart account gives you access to today's visit and all your visits, tests, and labs performed at [facility]  click here if you don't have a [facility] MyChart account or go to [email or web address]  Consent: (Patient) Brenda Austin provided verbal consent for this virtual visit at the beginning of the encounter.  Current Medications:  Current Outpatient Medications:    cephALEXin (KEFLEX) 500 MG capsule, Take 1 capsule (500 mg total) by mouth 3 (three) times daily for 7 days., Disp: 21 capsule, Rfl: 0   triamcinolone  cream (KENALOG ) 0.1 %, Apply 1 Application topically 2 (two) times daily., Disp: 30 g, Rfl: 0   Medications ordered in this encounter:  Meds ordered this encounter  Medications   cephALEXin (KEFLEX) 500 MG capsule    Sig: Take 1 capsule (500 mg total) by mouth 3 (three) times daily for 7 days.    Dispense:  21 capsule    Refill:  0    Supervising Provider:   BLAISE ALEENE KIDD [[id number]]     *If you need refills on other medications prior to your next appointment, please contact your pharmacy*  Follow-Up: Call back or seek an in-person evaluation if the symptoms worsen or if the condition fails to improve as anticipated.  [facility] [phone]  Other Instructions Please keep skin clean and dry. You can apply cool compresses to the area. Take the antibiotic as directed. Ok to start OTC Claritin short-term. If you note any non-resolving, new, or worsening symptoms despite treatment, please seek an in-person evaluation ASAP.    If you have been instructed to have an in-person evaluation today at a local Urgent Care facility,  please use the link below. It will take you to a list of all of our available [facility], including address, phone number and hours of operation. Please do not delay care.  [facility]  If you or a family member do not have a primary care provider, use the link below to schedule a visit and establish care. When you choose a [facility] primary care physician or advanced practice provider, you gain a long-term partner in health. Find a Primary Care Provider  Learn more about [facility]'s in-office and virtual care options: [facility] - Get Care Now

## 2024-05-31 NOTE — Progress Notes (Signed)
 Virtual Visit Consent   Brenda Austin, you are scheduled for a virtual visit with a [facility] provider today. Just as with appointments in the office, your consent must be obtained to participate. Your consent will be active for this visit and any virtual visit you may have with one of our providers in the next 365 days. If you have a MyChart account, a copy of this consent can be sent to you electronically.  As this is a virtual visit, video technology does not allow for your provider to perform a traditional examination. This may limit your provider's ability to fully assess your condition. If your provider identifies any concerns that need to be evaluated in person or the need to arrange testing (such as labs, EKG, etc.), we will make arrangements to do so. Although advances in technology are sophisticated, we cannot ensure that it will always work on either your end or our end. If the connection with a video visit is poor, the visit may have to be switched to a telephone visit. With either a video or telephone visit, we are not always able to ensure that we have a secure connection.  By engaging in this virtual visit, you consent to the provision of healthcare and authorize for your insurance to be billed (if applicable) for the services provided during this visit. Depending on your insurance coverage, you may receive a charge related to this service.  I need to obtain your verbal consent now. Are you willing to proceed with your visit today? Dalyn Kjos Tumlin has provided verbal consent on 05/31/2024 for a virtual visit (video or telephone). Brenda Austin, [place]  Date: 05/31/2024 3:13 PM   Virtual Visit via Video Note   I, Brenda Austin, connected with  Asra Gambrel Hudgins  ([id number], [birth year]) on 05/31/24 at  3:00 PM EDT by a video-enabled telemedicine application and verified that I am speaking with the correct person using two identifiers.  Location: Patient: Virtual Visit Location  Patient: Home Provider: Virtual Visit Location Provider: Home Office   I discussed the limitations of evaluation and management by telemedicine and the availability of in person appointments. The patient expressed understanding and agreed to proceed.    History of Present Illness: Brenda Austin is a 68 y.o. who identifies as a female who was assigned female at birth, and is being seen today for recurrent area of redness of her L forearm. Initially seen about 4 weeks ago for itchy small circular rash around the site of a spider bite. Was diagnosed with local allergic reaction/dermatitis and given Triamcinolone  to apply BID. Notes she applied it 9/9-23 with complete resolution of symptoms. Over past few days noting recurring redness, but more widespread in the area, along with warmth and some itch and tenderness. Denies any drainage. Denies fever, chills. Notes feels similar to when she got cellulitis of her leg in the past.  HPI: HPI  Problems:  Patient Active Problem List   Diagnosis Date Noted   Nausea, Vomitting, Diarrhea 04/14/2023   Animal bite 04/14/2023   Sepsis (HCC) 04/13/2023   AKI (acute kidney injury) 04/13/2023    Allergies:  Allergies  Allergen Reactions   Clindamycin/Lincomycin Nausea And Vomiting   Codeine Sulfate    Tape Rash   Medications:  Current Outpatient Medications:    cephALEXin (KEFLEX) 500 MG capsule, Take 1 capsule (500 mg total) by mouth 3 (three) times daily for 7 days., Disp: 21 capsule, Rfl: 0   triamcinolone  cream (KENALOG )  0.1 %, Apply 1 Application topically 2 (two) times daily., Disp: 30 g, Rfl: 0  Observations/Objective: Patient is well-developed, well-nourished in no acute distress.  Resting comfortably  at home.  Head is normocephalic, atraumatic.  No labored breathing.  Speech is clear and coherent with logical content.  Patient is alert and oriented at baseline.  L forearm with a 5 cm rounded area of erythema without papular/vesicular  lesions. Area is firm to touch and tender per patient. No visible fluctuance noted.  Assessment and Plan: 1. Cellulitis of left forearm (Primary) - cephALEXin (KEFLEX) 500 MG capsule; Take 1 capsule (500 mg total) by mouth 3 (three) times daily for 7 days.  Dispense: 21 capsule; Refill: 0  Supportive measures and OTC medications reviewed. Start OTC antihistamine. Cold compress. Keflex per orders. Follow-up in person for any non-resolving, new or worsening symptoms despite treatment.   Follow Up Instructions: I discussed the assessment and treatment plan with the patient. The patient was provided an opportunity to ask questions and all were answered. The patient agreed with the plan and demonstrated an understanding of the instructions.  A copy of instructions were sent to the patient via MyChart unless otherwise noted below.   The patient was advised to call back or seek an in-person evaluation if the symptoms worsen or if the condition fails to improve as anticipated.    Brenda Velma Lunger, PA-C

## 2024-06-21 ENCOUNTER — Telehealth: Admitting: Emergency Medicine

## 2024-06-21 DIAGNOSIS — L03114 Cellulitis of left upper limb: Secondary | ICD-10-CM

## 2024-06-21 NOTE — Progress Notes (Signed)
 Virtual Visit Consent   [name], you are scheduled for a virtual visit with a [facility] provider today. Just as with appointments in the office, your consent must be obtained to participate. Your consent will be active for this visit and any virtual visit you may have with one of our providers in the next 365 days. If you have a MyChart account, a copy of this consent can be sent to you electronically.  As this is a virtual visit, video technology does not allow for your provider to perform a traditional examination. This may limit your provider's ability to fully assess your condition. If your provider identifies any concerns that need to be evaluated in person or the need to arrange testing (such as labs, EKG, etc.), we will make arrangements to do so. Although advances in technology are sophisticated, we cannot ensure that it will always work on either your end or our end. If the connection with a video visit is poor, the visit may have to be switched to a telephone visit. With either a video or telephone visit, we are not always able to ensure that we have a secure connection.  By engaging in this virtual visit, you consent to the provision of healthcare and authorize for your insurance to be billed (if applicable) for the services provided during this visit. Depending on your insurance coverage, you may receive a charge related to this service.  I need to obtain your verbal consent now. Are you willing to proceed with your visit today? Rodney Wigger [place] has provided verbal consent on 06/21/2024 for a virtual visit (video or telephone). Jon CHRISTELLA Belt, NP  Date: 06/21/2024 2:40 PM   Virtual Visit via Video Note   I, Jon CHRISTELLA Belt, connected with  Brenda Austin  ([id number], [birth year]) on 06/21/24 at  2:30 PM EDT by a video-enabled telemedicine application and verified that I am speaking with the correct person using two identifiers.  Location: Patient: Virtual Visit Location Patient:  Home Provider: Virtual Visit Location Provider: Home Office   I discussed the limitations of evaluation and management by telemedicine and the availability of in person appointments. The patient expressed understanding and agreed to proceed.    History of Present Illness: Brenda Austin is a 68 y.o. who identifies as a female who was assigned female at birth, and is being seen today for f/u cellulitis. Was seen 9/30 in virtual urgent care and dx cellulitis L forearm. Took keflex for a week. Mostly healed but not completely. Now redness is returning.   HPI: HPI  Problems:  Patient Active Problem List   Diagnosis Date Noted   Nausea, Vomitting, Diarrhea 04/14/2023   Animal bite 04/14/2023   Sepsis (HCC) 04/13/2023   AKI (acute kidney injury) 04/13/2023    Allergies:  Allergies  Allergen Reactions   Clindamycin/Lincomycin Nausea And Vomiting   Codeine Sulfate    Tape Rash   Medications:  Current Outpatient Medications:    triamcinolone  cream (KENALOG ) 0.1 %, Apply 1 Application topically 2 (two) times daily., Disp: 30 g, Rfl: 0  Observations/Objective: Patient is well-developed, well-nourished in no acute distress.  Resting comfortably  at home.  Head is normocephalic, atraumatic.  No labored breathing.  Speech is clear and coherent with logical content.  Patient is alert and oriented at baseline.    Assessment and Plan: 1. Cellulitis of left forearm (Primary)  At this point, needs in person care.   Follow Up Instructions: I discussed the assessment and  treatment plan with the patient. The patient was provided an opportunity to ask questions and all were answered. The patient agreed with the plan and demonstrated an understanding of the instructions.  A copy of instructions were sent to the patient via MyChart unless otherwise noted below.    The patient was advised to call back or seek an in-person evaluation if the symptoms worsen or if the condition fails to improve as  anticipated.    Jon CHRISTELLA Belt, NP

## 2024-06-21 NOTE — Patient Instructions (Signed)
  Ronal ORN Wyndham, thank you for joining Jon CHRISTELLA Belt, NP for today's virtual visit.  While this provider is not your primary care provider (PCP), if your PCP is located in our provider database this encounter information will be shared with them immediately following your visit.   A [facility] MyChart account gives you access to today's visit and all your visits, tests, and labs performed at [facility]  click here if you don't have a [facility] MyChart account or go to [email or web address]  Consent: (Patient) Ronal ORN Ranta provided verbal consent for this virtual visit at the beginning of the encounter.  Current Medications:  Current Outpatient Medications:    triamcinolone  cream (KENALOG ) 0.1 %, Apply 1 Application topically 2 (two) times daily., Disp: 30 g, Rfl: 0   Medications ordered in this encounter:  No orders of the defined types were placed in this encounter.    *If you need refills on other medications prior to your next appointment, please contact your pharmacy*  Follow-Up: Call back or seek an in-person evaluation if the symptoms worsen or if the condition fails to improve as anticipated.  [facility] [phone]  Other Instructions  Please be seen in person today for your possibly worsening cellulitis. Urgent care is fine to go to.    If you have been instructed to have an in-person evaluation today at a local Urgent Care facility, please use the link below. It will take you to a list of all of our available [facility], including address, phone number and hours of operation. Please do not delay care.  [facility]  If you or a family member do not have a primary care provider, use the link below to schedule a visit and establish care. When you choose a [facility] primary care physician or advanced practice provider, you gain a long-term partner in health. Find a Primary Care Provider  Learn more about  [facility] in-office and virtual care options: [facility] - Get Care Now

## 2024-06-22 ENCOUNTER — Ambulatory Visit
Admission: RE | Admit: 2024-06-22 | Discharge: 2024-06-22 | Disposition: A | Discharge status: Home / Self Care | Source: Ambulatory Visit | Attending: Family Medicine | Admitting: Family Medicine

## 2024-06-22 VITALS — BP 121/83 | HR 80 | Temp 97.4°F | Resp 16

## 2024-06-22 DIAGNOSIS — R21 Rash and other nonspecific skin eruption: Secondary | ICD-10-CM | POA: Diagnosis not present

## 2024-06-22 DIAGNOSIS — N63 Unspecified lump in unspecified breast: Secondary | ICD-10-CM | POA: Insufficient documentation

## 2024-06-22 DIAGNOSIS — N952 Postmenopausal atrophic vaginitis: Secondary | ICD-10-CM | POA: Insufficient documentation

## 2024-06-22 MED ORDER — DOXYCYCLINE HYCLATE 100 MG PO CAPS: 100.0000 mg | ORAL_CAPSULE | Freq: Two times a day (BID) | ORAL | 0 refills | Status: AC

## 2024-06-22 NOTE — ED Provider Notes (Signed)
 [facility]    CSN: [id number] Arrival date & time: 06/22/24  1146      History   Chief Complaint Chief Complaint  Patient presents with   Follow-up    Entered by patient    HPI Brenda Austin is a 68 y.o. female.   HPI Here for persistence and recurrence of skin irritation and rash.  September 3 she was seen for some itching and irritation on her left arm.  Just prior to when that it started she had added up spider off her arm and is concerned that it bitten her.  Triamcinolone  cream was prescribed and it did help, but the abnormality persisted some so she had a video visit at the end of September and was prescribed Keflex.  Taking that it did seem to improve, now it is worsening again and 1 spot is still pretty itchy if it is touched or if something brushes across it.  No fever or drainage.   Past Medical History:  Diagnosis Date   Basal cell carcinoma    Horseshoe kidney    Kidney stone     Patient Active Problem List   Diagnosis Date Noted   Atrophic vaginitis 06/22/2024   Breast lump 06/22/2024   History of hysterectomy 04/14/2024   Nausea, Vomitting, Diarrhea 04/14/2023   Animal bite 04/14/2023   Sepsis (HCC) 04/13/2023   AKI (acute kidney injury) 04/13/2023    Past Surgical History:  Procedure Laterality Date   LIPOMA EXCISION     PARTIAL HYSTERECTOMY      OB History   No obstetric history on file.      Home Medications    Prior to Admission medications   Medication Sig Start Date End Date Taking? Authorizing Provider  doxycycline (VIBRAMYCIN) 100 MG capsule Take 1 capsule (100 mg total) by mouth 2 (two) times daily for 7 days. 06/22/24 06/29/24 Yes Vonna Sharlet POUR, MD  triamcinolone  cream (KENALOG ) 0.1 % Apply 1 Application topically 2 (two) times daily. 05/10/24  Yes Christopher Savannah, PA-C    Family History Family History  Problem Relation Age of Onset   Hypertension Mother    CVA Mother    Prostate cancer Father     Testicular cancer Brother     Social History Social History   Tobacco Use   Smoking status: Former    Current packs/day: 0.00    Types: Cigarettes    Quit date: 04/13/2010    Years since quitting: 14.2    Passive exposure: Past   Smokeless tobacco: Never  Vaping Use   Vaping status: Never Used  Substance Use Topics   Alcohol use: Yes    Comment: occ   Drug use: Not Currently     Allergies   Clindamycin, Clindamycin/lincomycin, Codeine, Codeine sulfate, and Tape   Review of Systems Review of Systems   Physical Exam Triage Vital Signs ED Triage Vitals [06/22/24 1218]  Encounter Vitals Group     BP 121/83     Girls Systolic BP Percentile      Girls Diastolic BP Percentile      Boys Systolic BP Percentile      Boys Diastolic BP Percentile      Pulse Rate 80     Resp 16     Temp (!) 97.4 F (36.3 C)     Temp Source Oral     SpO2 97 %     Weight      Height      Head Circumference  Peak Flow      Pain Score 0     Pain Loc      Pain Education      Exclude from Growth Chart    No data found.  Updated Vital Signs BP 121/83 (BP Location: Left Arm)   Pulse 80   Temp (!) 97.4 F (36.3 C) (Oral)   Resp 16   SpO2 97%   Visual Acuity Right Eye Distance:   Left Eye Distance:   Bilateral Distance:    Right Eye Near:   Left Eye Near:    Bilateral Near:     Physical Exam Vitals reviewed.  Constitutional:      General: She is not in acute distress.    Appearance: She is not ill-appearing, toxic-appearing or diaphoretic.  Skin:    Coloration: Skin is not jaundiced or pale.     Comments: There is a faint scaly mildly erythematous rash in a circular distribution, about 2.5 cm in diameter, on her left forearm.  It is fainter than when it was first photo graft in early September.  The proximal end does have a little bit of induration and more erythema there.  No drainage currently.  Neurological:     Mental Status: She is alert and oriented to person,  place, and time.  Psychiatric:        Behavior: Behavior normal.      UC Treatments / Results  Labs (all labs ordered are listed, but only abnormal results are displayed) Labs Reviewed  LYME DISEASE SEROLOGY W/REFLEX    EKG   Radiology No results found.  Procedures Procedures (including critical care time)  Medications Ordered in UC Medications - No data to display  Initial Impression / Assessment and Plan / UC Course  I have reviewed the triage vital signs and the nursing notes.  Pertinent labs & imaging results that were available during my care of the patient were reviewed by me and considered in my medical decision making (see chart for details).     Since she improved so much with the triamcinolone , and then to have her restart that twice a day for about 2 weeks.  With the indurated area that is on the proximal part of this area, I want to treat for possible skin infection so doxycycline is sent in.  Lyme titers are drawn to address her concerns and staff will notify her if those are abnormal.  I have asked her to follow-up with primary care  Final Clinical Impressions(s) / UC Diagnoses   Final diagnoses:  Rash and nonspecific skin eruption     Discharge Instructions      Take doxycycline 100 mg --1 capsule 2 times daily for 7 days  Use the triamcinolone  cream that you already have at home twice daily on the spot for up to 2 weeks.  We have drawn blood to check for antibodies to Lyme disease.  Staff will notify you if there is anything significantly abnormal     ED Prescriptions     Medication Sig Dispense Auth. Provider   doxycycline (VIBRAMYCIN) 100 MG capsule Take 1 capsule (100 mg total) by mouth 2 (two) times daily for 7 days. 14 capsule Vonna, Naydeen Speirs K, MD      PDMP not reviewed this encounter.   Vonna Sharlet POUR, MD 06/22/24 [phone]

## 2024-06-22 NOTE — ED Triage Notes (Addendum)
 Pt here for wound recheck of spider bite on L forearm. Originally seen at [facility] on 9/9, prescribed triamcinolone  cream at the time with some mild improvement. Had cone video visits where keflex was prescribed and has since finished that full course. Pt has improvement to area, but there is still lingering redness and she reports small red bumps will pop up around original site. No other med use for symptoms. Original image in 9/9/ note.   Also asks for lyme disease testing.

## 2024-06-22 NOTE — Discharge Instructions (Signed)
 Take doxycycline 100 mg --1 capsule 2 times daily for 7 days  Use the triamcinolone  cream that you already have at home twice daily on the spot for up to 2 weeks.  We have drawn blood to check for antibodies to Lyme disease.  Staff will notify you if there is anything significantly abnormal

## 2024-06-23 LAB — LYME DISEASE SEROLOGY W/REFLEX: Lyme Total Antibody EIA: NEGATIVE

## 2024-07-07 DIAGNOSIS — H1045 Other chronic allergic conjunctivitis: Secondary | ICD-10-CM | POA: Diagnosis not present

## 2024-07-07 DIAGNOSIS — H04123 Dry eye syndrome of bilateral lacrimal glands: Secondary | ICD-10-CM | POA: Diagnosis not present

## 2024-07-07 DIAGNOSIS — H35032 Hypertensive retinopathy, left eye: Secondary | ICD-10-CM | POA: Diagnosis not present

## 2024-07-07 DIAGNOSIS — H02831 Dermatochalasis of right upper eyelid: Secondary | ICD-10-CM | POA: Diagnosis not present

## 2024-08-03 ENCOUNTER — Ambulatory Visit: Admitting: Family
